# Patient Record
Sex: Male | Born: 1940 | Race: White | Hispanic: No | Marital: Married | Smoking: Former smoker
Health system: Southern US, Community
[De-identification: ages and names within clinical notes are randomized; demographics above are authoritative.]

## PROBLEM LIST (undated history)

## (undated) DIAGNOSIS — N400 Enlarged prostate without lower urinary tract symptoms: Secondary | ICD-10-CM

## (undated) DIAGNOSIS — E785 Hyperlipidemia, unspecified: Secondary | ICD-10-CM

## (undated) DIAGNOSIS — I10 Essential (primary) hypertension: Secondary | ICD-10-CM

## (undated) DIAGNOSIS — M1990 Unspecified osteoarthritis, unspecified site: Secondary | ICD-10-CM

## (undated) DIAGNOSIS — R5383 Other fatigue: Secondary | ICD-10-CM

## (undated) HISTORY — DX: Essential (primary) hypertension: I10

## (undated) HISTORY — DX: Hyperlipidemia, unspecified: E78.5

## (undated) HISTORY — PX: JOINT REPLACEMENT: SHX530

## (undated) HISTORY — DX: Other fatigue: R53.83

## (undated) HISTORY — PX: HERNIA REPAIR: SHX51

---

## 2012-01-26 ENCOUNTER — Encounter (HOSPITAL_COMMUNITY)
Admission: RE | Admit: 2012-01-26 | Discharge: 2012-01-26 | Disposition: A | Discharge status: Home / Self Care | Payer: Medicare Other | Source: Ambulatory Visit | Attending: Orthopedic Surgery | Admitting: Orthopedic Surgery

## 2012-01-26 ENCOUNTER — Encounter (HOSPITAL_COMMUNITY)
Admission: RE | Admit: 2012-01-26 | Discharge: 2012-01-26 | Disposition: A | Discharge status: Home / Self Care | Payer: Medicare Other | Source: Ambulatory Visit | Attending: Orthopaedic Surgery | Admitting: Orthopaedic Surgery

## 2012-01-26 ENCOUNTER — Encounter (HOSPITAL_COMMUNITY): Payer: Self-pay

## 2012-01-26 HISTORY — DX: Unspecified osteoarthritis, unspecified site: M19.90

## 2012-01-26 HISTORY — DX: Essential (primary) hypertension: I10

## 2012-01-26 HISTORY — DX: Benign prostatic hyperplasia without lower urinary tract symptoms: N40.0

## 2012-01-26 LAB — URINALYSIS, ROUTINE W REFLEX MICROSCOPIC
Bilirubin Urine: NEGATIVE
Glucose, UA: NEGATIVE mg/dL
Hgb urine dipstick: NEGATIVE
Ketones, ur: NEGATIVE mg/dL
Leukocytes, UA: NEGATIVE
Nitrite: NEGATIVE
Protein, ur: NEGATIVE mg/dL
Specific Gravity, Urine: 1.013 (ref 1.005–1.030)
Urobilinogen, UA: 0.2 mg/dL (ref 0.0–1.0)
pH: 7 (ref 5.0–8.0)

## 2012-01-26 LAB — CBC
HCT: 40 % (ref 39.0–52.0)
Hemoglobin: 13.4 g/dL (ref 13.0–17.0)
MCHC: 33.5 g/dL (ref 30.0–36.0)
RBC: 4.67 MIL/uL (ref 4.22–5.81)
WBC: 8.8 10*3/uL (ref 4.0–10.5)

## 2012-01-26 LAB — TYPE AND SCREEN
ABO/RH(D): A POS
Antibody Screen: NEGATIVE

## 2012-01-26 LAB — COMPREHENSIVE METABOLIC PANEL
ALT: 11 U/L (ref 0–53)
Alkaline Phosphatase: 85 U/L (ref 39–117)
BUN: 17 mg/dL (ref 6–23)
Chloride: 101 mEq/L (ref 96–112)
GFR calc Af Amer: 82 mL/min — ABNORMAL LOW (ref >90)
Glucose, Bld: 89 mg/dL (ref 70–99)
Potassium: 3.8 mEq/L (ref 3.5–5.1)
Sodium: 139 mEq/L (ref 135–145)
Total Bilirubin: 0.3 mg/dL (ref 0.3–1.2)
Total Protein: 7.8 g/dL (ref 6.0–8.3)

## 2012-01-26 LAB — PROTIME-INR
INR: 1.04 (ref 0.00–1.49)
Prothrombin Time: 13.5 seconds (ref 11.6–15.2)

## 2012-01-26 LAB — APTT: aPTT: 29 seconds (ref 24–37)

## 2012-01-26 LAB — SURGICAL PCR SCREEN: MRSA, PCR: NEGATIVE

## 2012-01-26 LAB — ABO/RH: ABO/RH(D): A POS

## 2012-01-26 NOTE — Pre-Procedure Instructions (Addendum)
20 [address]  01/26/2012   Your procedure is scheduled on:  Tuesday 02-02-2012  Report to [facility] Stay [facility] at 8:25 AM.  Call this number if you have problems the morning of surgery: [phone]   Remember:   Do not eat food or drink:After Midnight  .Monday .    Take these medicines the morning of surgery with A SIP OF WATER: none   Do not wear jewelry,  Do not wear lotions, powders, or perfumes. You may wear deodorant.  Do not shave 48 hours prior to surgery. Men may shave face and neck.  Do not bring valuables to the hospital.  Contacts, dentures or bridgework may not be worn into surgery.  Leave suitcase in the car. After surgery it may be brought to your room.  For patients admitted to the hospital, checkout time is 11:00 AM the day of discharge.      Special Instructions: Incentive Spirometry - Practice and bring it with you on the day of surgery. and CHG Shower Use Special Wash: 1/2 bottle night before surgery and 1/2 bottle morning of surgery.     Please read over the following fact sheets that you were given: Pain Booklet, Coughing and Deep Breathing, Blood Transfusion Information, MRSA Information and Surgical Site Infection Prevention

## 2012-01-27 LAB — URINE CULTURE
Colony Count: NO GROWTH
Culture: NO GROWTH

## 2012-01-27 NOTE — Consult Note (Addendum)
Anesthesia chart review: Patient is a 71 year old male scheduled for exploration left total knee, possible removal of prosthesis and placement of antibiotic spacer versus revision of total knee on 02/02/12 by Dr. Cleophas Dunker. History includes nonsmoker, hypertension, arthritis (RA), BPH. PCP is Dr. Darius Bump at [facility]. He has cleared William Arroyo for this procedure.  EKG on 01/05/12 (PCP) showed NSR, borderline LAD, first degree AVB.  Chest x-ray on 01/26/2012 showed no acute cardiopulmonary abnormality.  Labs noted.  Anticipate he can proceed as planned.  Shonna Chock, PA-C

## 2012-02-01 MED ORDER — CHLORHEXIDINE GLUCONATE 4 % EX LIQD: 60.0000 mL | Freq: Every day | CUTANEOUS | Status: DC

## 2012-02-01 MED ORDER — SODIUM CHLORIDE 0.9 % IV SOLN: INTRAVENOUS | Status: DC

## 2012-02-01 MED ORDER — CHLORHEXIDINE GLUCONATE 4 % EX LIQD: 60.0000 mL | Freq: Once | CUTANEOUS | Status: DC

## 2012-02-01 MED ORDER — ACETAMINOPHEN 10 MG/ML IV SOLN
1000.0000 mg | Freq: Once | INTRAVENOUS | Status: AC
  Administered 2012-02-02: 1000 mg via INTRAVENOUS
  Filled 2012-02-01: qty 100

## 2012-02-01 NOTE — H&P (Signed)
CHIEF COMPLAINT:  Painful left knee.    HISTORY OF PRESENT ILLNESS:  William Arroyo is a very pleasant 71 year old white male who is seen today for evaluation of his left knee.  He has had a left total knee replacement, performed at [facility], about 13 years ago.  Over the past 4-5 years, though, he has been having recurrent effusions of the knee.  He has had multiple aspirations by Dr. Darius Bump and also several aspirations over the last several months by Dr. Cleophas Dunker.  He has been having catching symptoms in his knee now over the past 4-5 months.  He has a history of rheumatoid arthritis and on Plaquenil.  The pain is intermittent and mild.  The biggest problem is just his recurrent swelling and catching symptoms.  He does not have much pain at nighttime.     He has been seen and an aspiration of the knee was obtained with 16,000 white cells and a few white cells on the Gram stain, but cultures were negative.  The sed rate was 22 and C-reactive protein 0.87.  These were just mildly elevated.     A 3-phase bone scan revealed marked synovitis of the left knee, particularly around the patella, including superior, medial and lateral to the patella.  Intense abnormal activity at the patella itself.  This could represent loosening of the patella component.     He continues to have pain and discomfort and the effusions.  Even a corticosteroid injection was performed in June in hopes that the synovitis would improve, however, he continues to have symptoms.  He is seen today for reevaluation.     PAST MEDICAL HISTORY:  In general, his health is good.  Hospitalizations have included that of his total joint replacement.     MEDICATIONS:   1.  Flomax 0.4 mg daily. 2.  Aspirin 81 mg.  3.  Viagra 100 mg p.r.n.  4.  Lisinopril/hydrochlorothiazide 10/12.5.     REVIEW OF SYSTEMS:  A 14-point review of systems is positive for glasses, decreased hearing and hypertension for 5 years.  He has had pain with urination for 10  years and has had prostate problems and he does use Flomax for that.  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    ALLERGIES:  Methotrexate.     FAMILY HISTORY:  Positive for diabetes, hypertension and arthritis.   SOCIAL HISTORY:  He is a 71 year old white male who is retired from the [employer].  He smoked as a teen, just for a very short period of time.  He uses alcohol intermittently.    PHYSICAL EXAMINATION:  Today reveals a 71 year old white, well developed, well nourished, alert, pleasant and cooperative in moderate distress secondary to left knee swelling and pain.  He is 5 feet 11 inches.  He weighs 175 pounds.  BMI 24.4   Vital signs reveal a temperature of 97.8, pulse 69, respiration 18, blood pressure 121/72.     His head is normocephalic.     Eyes, ears, nose and throat were benign.   Neck was supple with no thyromegaly.    Chest revealed good expansion.    Lungs are essentially clear to auscultation.    Cardiac had a regular rhythm and rate, normal S1 and S2, no murmurs noted.   Pulses were 1+ bilaterally and symmetric in the lower extremities.    Abdomen shows scaphoid soft and nontender.  No masses palpable and normal bowel sounds present.  Genital, rectal and breast exam not indicated for an orthopedic evaluation.     CNS:  He is alert and oriented x3 and cranial nerves II-XII grossly intact.     Today he has range of motion from 3 degrees to 105 degrees.  He has good ligamentous stability.  He does have a little bit of crepitus with range of motion.  The calf is supple and nontender.  He is neurovascularly intact distally.  He does have a mild anterior drawer.     CLINICAL IMPRESSION:   1.  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   2.  Recurrent effusions.   3.  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    RECOMMENDATIONS:  At this time we feel that he is a candidate for surgical  intervention. that of an exploration of the knee with possible replacement of the patella or possibly that of removing all prosthesis and doing a revision or possibly that of taking all components out and placement with an antibiotic spacer.     The procedure, risks and benefits were fully explained to him and he is understanding.  He would like to proceed with this in the very near future.    Oris Drone Aleda Grana [facility] [phone]  02/01/2012 6:40 PM

## 2012-02-02 ENCOUNTER — Inpatient Hospital Stay (HOSPITAL_COMMUNITY): Payer: Medicare Other | Admitting: Vascular Surgery

## 2012-02-02 ENCOUNTER — Encounter (HOSPITAL_COMMUNITY): Payer: Self-pay | Admitting: Vascular Surgery

## 2012-02-02 ENCOUNTER — Inpatient Hospital Stay (HOSPITAL_COMMUNITY)
Admission: RE | Admit: 2012-02-02 | Discharge: 2012-02-04 | DRG: 488 | Disposition: A | Discharge status: Home Health | Payer: Medicare Other | Source: Ambulatory Visit | Attending: Orthopaedic Surgery | Admitting: Orthopaedic Surgery

## 2012-02-02 ENCOUNTER — Encounter (HOSPITAL_COMMUNITY)
Admission: RE | Disposition: A | Discharge status: Home Health | Payer: Self-pay | Source: Ambulatory Visit | Attending: Orthopaedic Surgery

## 2012-02-02 ENCOUNTER — Encounter (HOSPITAL_COMMUNITY): Payer: Self-pay | Admitting: *Deleted

## 2012-02-02 DIAGNOSIS — T8489XA Other specified complication of internal orthopedic prosthetic devices, implants and grafts, initial encounter: Principal | ICD-10-CM | POA: Diagnosis present

## 2012-02-02 DIAGNOSIS — Z01812 Encounter for preprocedural laboratory examination: Secondary | ICD-10-CM

## 2012-02-02 DIAGNOSIS — I10 Essential (primary) hypertension: Secondary | ICD-10-CM | POA: Diagnosis present

## 2012-02-02 DIAGNOSIS — Z8249 Family history of ischemic heart disease and other diseases of the circulatory system: Secondary | ICD-10-CM

## 2012-02-02 DIAGNOSIS — Z96659 Presence of unspecified artificial knee joint: Secondary | ICD-10-CM

## 2012-02-02 DIAGNOSIS — M069 Rheumatoid arthritis, unspecified: Secondary | ICD-10-CM | POA: Diagnosis present

## 2012-02-02 DIAGNOSIS — D62 Acute posthemorrhagic anemia: Secondary | ICD-10-CM | POA: Diagnosis not present

## 2012-02-02 DIAGNOSIS — Y92009 Unspecified place in unspecified non-institutional (private) residence as the place of occurrence of the external cause: Secondary | ICD-10-CM

## 2012-02-02 DIAGNOSIS — Z87891 Personal history of nicotine dependence: Secondary | ICD-10-CM

## 2012-02-02 DIAGNOSIS — Z8261 Family history of arthritis: Secondary | ICD-10-CM

## 2012-02-02 DIAGNOSIS — Z7901 Long term (current) use of anticoagulants: Secondary | ICD-10-CM

## 2012-02-02 DIAGNOSIS — T8484XA Pain due to internal orthopedic prosthetic devices, implants and grafts, initial encounter: Secondary | ICD-10-CM

## 2012-02-02 DIAGNOSIS — M6580 Other synovitis and tenosynovitis, unspecified site: Secondary | ICD-10-CM | POA: Diagnosis present

## 2012-02-02 DIAGNOSIS — Y831 Surgical operation with implant of artificial internal device as the cause of abnormal reaction of the patient, or of later complication, without mention of misadventure at the time of the procedure: Secondary | ICD-10-CM | POA: Diagnosis present

## 2012-02-02 DIAGNOSIS — Z833 Family history of diabetes mellitus: Secondary | ICD-10-CM

## 2012-02-02 DIAGNOSIS — Z79899 Other long term (current) drug therapy: Secondary | ICD-10-CM

## 2012-02-02 HISTORY — PX: TOTAL KNEE REVISION: SHX996

## 2012-02-02 SURGERY — TOTAL KNEE REVISION
Anesthesia: General | Site: Knee | Laterality: Left | Wound class: Clean

## 2012-02-02 MED ORDER — FENTANYL CITRATE 0.05 MG/ML IJ SOLN
INTRAMUSCULAR | Status: AC
  Filled 2012-02-02: qty 2

## 2012-02-02 MED ORDER — THROMBIN 20000 UNITS EX KIT
PACK | CUTANEOUS | Status: DC | PRN
  Administered 2012-02-02: 20000 [IU] via TOPICAL

## 2012-02-02 MED ORDER — FENTANYL CITRATE 0.05 MG/ML IJ SOLN
INTRAMUSCULAR | Status: DC | PRN
  Administered 2012-02-02: 250 ug via INTRAVENOUS

## 2012-02-02 MED ORDER — BUPIVACAINE-EPINEPHRINE PF 0.5-1:200000 % IJ SOLN
INTRAMUSCULAR | Status: DC | PRN
  Administered 2012-02-02: 30 mL

## 2012-02-02 MED ORDER — ONDANSETRON HCL 4 MG PO TABS: 4.0000 mg | ORAL_TABLET | Freq: Four times a day (QID) | ORAL | Status: DC | PRN

## 2012-02-02 MED ORDER — METHOCARBAMOL 500 MG PO TABS
500.0000 mg | ORAL_TABLET | Freq: Four times a day (QID) | ORAL | Status: DC | PRN
  Administered 2012-02-02: 500 mg via ORAL
  Filled 2012-02-02: qty 1

## 2012-02-02 MED ORDER — BUPIVACAINE-EPINEPHRINE PF 0.25-1:200000 % IJ SOLN
INTRAMUSCULAR | Status: AC
  Filled 2012-02-02: qty 30

## 2012-02-02 MED ORDER — TAMSULOSIN HCL 0.4 MG PO CAPS
0.4000 mg | ORAL_CAPSULE | Freq: Every day | ORAL | Status: DC
  Administered 2012-02-02 – 2012-02-03 (×2): 0.4 mg via ORAL
  Filled 2012-02-02 (×3): qty 1

## 2012-02-02 MED ORDER — ACETAMINOPHEN 10 MG/ML IV SOLN
INTRAVENOUS | Status: AC
  Filled 2012-02-02: qty 100

## 2012-02-02 MED ORDER — EPHEDRINE SULFATE 50 MG/ML IJ SOLN
INTRAMUSCULAR | Status: DC | PRN
  Administered 2012-02-02 (×2): 5 mg via INTRAVENOUS

## 2012-02-02 MED ORDER — NEOSTIGMINE METHYLSULFATE 1 MG/ML IJ SOLN
INTRAMUSCULAR | Status: DC | PRN
  Administered 2012-02-02: 3 mg via INTRAVENOUS

## 2012-02-02 MED ORDER — ROCURONIUM BROMIDE 100 MG/10ML IV SOLN
INTRAVENOUS | Status: DC | PRN
  Administered 2012-02-02: 50 mg via INTRAVENOUS

## 2012-02-02 MED ORDER — LISINOPRIL 10 MG PO TABS
10.0000 mg | ORAL_TABLET | Freq: Every day | ORAL | Status: DC
  Administered 2012-02-03 – 2012-02-04 (×2): 10 mg via ORAL
  Filled 2012-02-02 (×3): qty 1

## 2012-02-02 MED ORDER — PHENYLEPHRINE HCL 10 MG/ML IJ SOLN
10.0000 mg | INTRAVENOUS | Status: DC | PRN
  Administered 2012-02-02: 20 ug/min via INTRAVENOUS

## 2012-02-02 MED ORDER — BISACODYL 10 MG RE SUPP: 10.0000 mg | Freq: Every day | RECTAL | Status: DC | PRN

## 2012-02-02 MED ORDER — MIDAZOLAM HCL 5 MG/5ML IJ SOLN
INTRAMUSCULAR | Status: DC | PRN
  Administered 2012-02-02: 2 mg via INTRAVENOUS

## 2012-02-02 MED ORDER — DOCUSATE SODIUM 100 MG PO CAPS
100.0000 mg | ORAL_CAPSULE | Freq: Two times a day (BID) | ORAL | Status: DC
  Administered 2012-02-02 – 2012-02-04 (×4): 100 mg via ORAL
  Filled 2012-02-02 (×5): qty 1

## 2012-02-02 MED ORDER — ONDANSETRON HCL 4 MG/2ML IJ SOLN: 4.0000 mg | Freq: Four times a day (QID) | INTRAMUSCULAR | Status: DC | PRN

## 2012-02-02 MED ORDER — OXYCODONE HCL 5 MG PO TABS
5.0000 mg | ORAL_TABLET | ORAL | Status: DC | PRN
  Administered 2012-02-02 – 2012-02-04 (×2): 10 mg via ORAL
  Filled 2012-02-02 (×3): qty 2

## 2012-02-02 MED ORDER — GLYCOPYRROLATE 0.2 MG/ML IJ SOLN
INTRAMUSCULAR | Status: DC | PRN
  Administered 2012-02-02: 0.4 mg via INTRAVENOUS

## 2012-02-02 MED ORDER — METOCLOPRAMIDE HCL 5 MG/ML IJ SOLN: 5.0000 mg | Freq: Three times a day (TID) | INTRAMUSCULAR | Status: DC | PRN

## 2012-02-02 MED ORDER — PROPOFOL 10 MG/ML IV BOLUS
INTRAVENOUS | Status: DC | PRN
  Administered 2012-02-02: 150 mg via INTRAVENOUS

## 2012-02-02 MED ORDER — MAGNESIUM HYDROXIDE 400 MG/5ML PO SUSP: 30.0000 mL | Freq: Every day | ORAL | Status: DC | PRN

## 2012-02-02 MED ORDER — ALPRAZOLAM 0.5 MG PO TABS
0.5000 mg | ORAL_TABLET | Freq: Every day | ORAL | Status: DC
  Administered 2012-02-02 – 2012-02-03 (×2): 0.5 mg via ORAL
  Filled 2012-02-02 (×2): qty 1

## 2012-02-02 MED ORDER — HYDROCHLOROTHIAZIDE 12.5 MG PO CAPS
12.5000 mg | ORAL_CAPSULE | Freq: Every day | ORAL | Status: DC
  Administered 2012-02-03 – 2012-02-04 (×2): 12.5 mg via ORAL
  Filled 2012-02-02 (×3): qty 1

## 2012-02-02 MED ORDER — LISINOPRIL-HYDROCHLOROTHIAZIDE 10-12.5 MG PO TABS: 1.0000 | ORAL_TABLET | Freq: Every day | ORAL | Status: DC

## 2012-02-02 MED ORDER — MIDAZOLAM HCL 2 MG/2ML IJ SOLN: 0.5000 mg | Freq: Once | INTRAMUSCULAR | Status: DC | PRN

## 2012-02-02 MED ORDER — BUPIVACAINE-EPINEPHRINE 0.25% -1:200000 IJ SOLN
INTRAMUSCULAR | Status: DC | PRN
  Administered 2012-02-02: 30 mL

## 2012-02-02 MED ORDER — SODIUM CHLORIDE 0.9 % IV SOLN: INTRAVENOUS | Status: DC

## 2012-02-02 MED ORDER — HYDROMORPHONE HCL PF 1 MG/ML IJ SOLN: 0.2500 mg | INTRAMUSCULAR | Status: DC | PRN

## 2012-02-02 MED ORDER — METOCLOPRAMIDE HCL 10 MG PO TABS: 5.0000 mg | ORAL_TABLET | Freq: Three times a day (TID) | ORAL | Status: DC | PRN

## 2012-02-02 MED ORDER — VANCOMYCIN HCL IN DEXTROSE 1-5 GM/200ML-% IV SOLN
1000.0000 mg | INTRAVENOUS | Status: DC
  Administered 2012-02-02 – 2012-02-03 (×2): 1000 mg via INTRAVENOUS
  Filled 2012-02-02 (×3): qty 200

## 2012-02-02 MED ORDER — ONDANSETRON HCL 4 MG/2ML IJ SOLN
INTRAMUSCULAR | Status: DC | PRN
  Administered 2012-02-02: 4 mg via INTRAVENOUS

## 2012-02-02 MED ORDER — PHENOL 1.4 % MT LIQD: 1.0000 | OROMUCOSAL | Status: DC | PRN

## 2012-02-02 MED ORDER — MIDAZOLAM HCL 2 MG/2ML IJ SOLN
1.0000 mg | INTRAMUSCULAR | Status: DC | PRN
  Administered 2012-02-02: 1 mg via INTRAVENOUS

## 2012-02-02 MED ORDER — FLEET ENEMA 7-19 GM/118ML RE ENEM: 1.0000 | ENEMA | Freq: Once | RECTAL | Status: AC | PRN

## 2012-02-02 MED ORDER — PROMETHAZINE HCL 25 MG/ML IJ SOLN: 6.2500 mg | INTRAMUSCULAR | Status: DC | PRN

## 2012-02-02 MED ORDER — VANCOMYCIN HCL 1000 MG IV SOLR
1000.0000 mg | INTRAVENOUS | Status: DC | PRN
  Administered 2012-02-02: 1000 mg via INTRAVENOUS

## 2012-02-02 MED ORDER — FENTANYL CITRATE 0.05 MG/ML IJ SOLN
50.0000 ug | INTRAMUSCULAR | Status: DC | PRN
  Administered 2012-02-02: 50 ug via INTRAVENOUS

## 2012-02-02 MED ORDER — RIVAROXABAN 10 MG PO TABS
10.0000 mg | ORAL_TABLET | Freq: Every day | ORAL | Status: DC
  Administered 2012-02-03 – 2012-02-04 (×2): 10 mg via ORAL
  Filled 2012-02-02 (×2): qty 1

## 2012-02-02 MED ORDER — LACTATED RINGERS IV SOLN
INTRAVENOUS | Status: DC | PRN
  Administered 2012-02-02 (×2): via INTRAVENOUS

## 2012-02-02 MED ORDER — HYDROMORPHONE HCL PF 1 MG/ML IJ SOLN
0.5000 mg | INTRAMUSCULAR | Status: DC | PRN
  Administered 2012-02-03: 0.5 mg via INTRAVENOUS
  Filled 2012-02-02: qty 1

## 2012-02-02 MED ORDER — MEPERIDINE HCL 25 MG/ML IJ SOLN: 6.2500 mg | INTRAMUSCULAR | Status: DC | PRN

## 2012-02-02 MED ORDER — MIDAZOLAM HCL 2 MG/2ML IJ SOLN
INTRAMUSCULAR | Status: AC
  Filled 2012-02-02: qty 2

## 2012-02-02 MED ORDER — LACTATED RINGERS IV SOLN
INTRAVENOUS | Status: DC
  Administered 2012-02-02: 09:00:00 via INTRAVENOUS

## 2012-02-02 MED ORDER — ACETAMINOPHEN 10 MG/ML IV SOLN
1000.0000 mg | Freq: Four times a day (QID) | INTRAVENOUS | Status: AC
  Administered 2012-02-02 – 2012-02-03 (×4): 1000 mg via INTRAVENOUS
  Filled 2012-02-02 (×4): qty 100

## 2012-02-02 MED ORDER — VANCOMYCIN HCL IN DEXTROSE 1-5 GM/200ML-% IV SOLN
INTRAVENOUS | Status: AC
  Filled 2012-02-02: qty 200

## 2012-02-02 MED ORDER — METHOCARBAMOL 100 MG/ML IJ SOLN
500.0000 mg | Freq: Four times a day (QID) | INTRAVENOUS | Status: DC | PRN
  Filled 2012-02-02: qty 5

## 2012-02-02 MED ORDER — THROMBIN 20000 UNITS EX KIT
PACK | CUTANEOUS | Status: AC
  Filled 2012-02-02: qty 1

## 2012-02-02 MED ORDER — KETOROLAC TROMETHAMINE 15 MG/ML IJ SOLN: 7.5000 mg | Freq: Four times a day (QID) | INTRAMUSCULAR | Status: DC

## 2012-02-02 MED ORDER — SODIUM CHLORIDE 0.9 % IR SOLN
Status: DC | PRN
  Administered 2012-02-02: 3000 mL
  Administered 2012-02-02: 1000 mL

## 2012-02-02 MED ORDER — ALUM & MAG HYDROXIDE-SIMETH 200-200-20 MG/5ML PO SUSP
30.0000 mL | ORAL | Status: DC | PRN
  Administered 2012-02-03: 30 mL via ORAL
  Filled 2012-02-02: qty 30

## 2012-02-02 MED ORDER — SODIUM CHLORIDE 0.9 % IV SOLN
INTRAVENOUS | Status: DC | PRN
  Administered 2012-02-02: 12:00:00 via INTRAVENOUS

## 2012-02-02 MED ORDER — MENTHOL 3 MG MT LOZG: 1.0000 | LOZENGE | OROMUCOSAL | Status: DC | PRN

## 2012-02-02 SURGICAL SUPPLY — 62 items
BANDAGE ESMARK 6X9 LF (GAUZE/BANDAGES/DRESSINGS) ×1 IMPLANT
BLADE SAGITTAL 25.0X1.19X90 (BLADE) ×2 IMPLANT
BLADE SAW SGTL 13.0X1.19X90.0M (BLADE) IMPLANT
BNDG ESMARK 6X9 LF (GAUZE/BANDAGES/DRESSINGS) ×2
BONE CHIP PRESERV 20CC (Bone Implant) ×2 IMPLANT
BOWL SMART MIX CTS (DISPOSABLE) IMPLANT
CEMENT HV SMART SET (Cement) ×2 IMPLANT
CLOTH BEACON ORANGE TIMEOUT ST (SAFETY) ×2 IMPLANT
CONT SPECI 4OZ STER CLIK (MISCELLANEOUS) ×2 IMPLANT
COVER SURGICAL LIGHT HANDLE (MISCELLANEOUS) ×2 IMPLANT
CUFF TOURNIQUET SINGLE 34IN LL (TOURNIQUET CUFF) ×2 IMPLANT
CUFF TOURNIQUET SINGLE 44IN (TOURNIQUET CUFF) IMPLANT
DRAPE EXTREMITY T 121X128X90 (DRAPE) ×2 IMPLANT
DRSG ADAPTIC 3X8 NADH LF (GAUZE/BANDAGES/DRESSINGS) IMPLANT
DRSG PAD ABDOMINAL 8X10 ST (GAUZE/BANDAGES/DRESSINGS) IMPLANT
DURAPREP 26ML APPLICATOR (WOUND CARE) ×2 IMPLANT
ELECT REM PT RETURN 9FT ADLT (ELECTROSURGICAL) ×2
ELECTRODE REM PT RTRN 9FT ADLT (ELECTROSURGICAL) ×1 IMPLANT
EVACUATOR 1/8 PVC DRAIN (DRAIN) IMPLANT
FACESHIELD LNG OPTICON STERILE (SAFETY) ×8 IMPLANT
GLOVE BIO SURGEON STRL SZ8.5 (GLOVE) ×2 IMPLANT
GLOVE BIOGEL PI IND STRL 8 (GLOVE) ×2 IMPLANT
GLOVE BIOGEL PI IND STRL 8.5 (GLOVE) ×1 IMPLANT
GLOVE BIOGEL PI INDICATOR 8 (GLOVE) ×2
GLOVE BIOGEL PI INDICATOR 8.5 (GLOVE) ×1
GLOVE ECLIPSE 8.0 STRL XLNG CF (GLOVE) ×4 IMPLANT
GLOVE ECLIPSE 8.5 STRL (GLOVE) ×2 IMPLANT
GLOVE SURG ORTHO 8.5 STRL (GLOVE) ×2 IMPLANT
GLOVE SURG SS PI 7.5 STRL IVOR (GLOVE) ×2 IMPLANT
GOWN PREVENTION PLUS XLARGE (GOWN DISPOSABLE) IMPLANT
GOWN PREVENTION PLUS XXLARGE (GOWN DISPOSABLE) ×4 IMPLANT
GOWN STRL NON-REIN LRG LVL3 (GOWN DISPOSABLE) ×2 IMPLANT
GOWN STRL REIN 3XL XLG LVL4 (GOWN DISPOSABLE) IMPLANT
GOWN STRL REIN XL XLG (GOWN DISPOSABLE) ×2 IMPLANT
HANDPIECE INTERPULSE COAX TIP (DISPOSABLE) ×1
KIT BASIN OR (CUSTOM PROCEDURE TRAY) ×2 IMPLANT
KIT ROOM TURNOVER OR (KITS) ×2 IMPLANT
LPS ARTI SURF EF 5-6 14MM (Orthopedic Implant) ×2 IMPLANT
MANIFOLD NEPTUNE II (INSTRUMENTS) ×2 IMPLANT
NEEDLE 22X1 1/2 (OR ONLY) (NEEDLE) ×2 IMPLANT
NS IRRIG 1000ML POUR BTL (IV SOLUTION) ×2 IMPLANT
PACK TOTAL JOINT (CUSTOM PROCEDURE TRAY) ×2 IMPLANT
PAD ARMBOARD 7.5X6 YLW CONV (MISCELLANEOUS) ×2 IMPLANT
PAD CAST 4YDX4 CTTN HI CHSV (CAST SUPPLIES) ×1 IMPLANT
PADDING CAST COTTON 4X4 STRL (CAST SUPPLIES) ×1
PATELLA ZIMMER 35MM (Orthopedic Implant) ×2 IMPLANT
SET HNDPC FAN SPRY TIP SCT (DISPOSABLE) ×1 IMPLANT
SPONGE GAUZE 4X4 12PLY (GAUZE/BANDAGES/DRESSINGS) IMPLANT
STAPLER VISISTAT 35W (STAPLE) ×2 IMPLANT
SUCTION FRAZIER TIP 10 FR DISP (SUCTIONS) IMPLANT
SUT BONE WAX W31G (SUTURE) ×2 IMPLANT
SUT ETHIBOND NAB CT1 #1 30IN (SUTURE) ×6 IMPLANT
SUT VIC AB 0 CT1 27 (SUTURE) ×1
SUT VIC AB 0 CT1 27XBRD ANBCTR (SUTURE) ×1 IMPLANT
SUT VIC AB 2-0 FS1 27 (SUTURE) ×2 IMPLANT
SWAB COLLECTION DEVICE MRSA (MISCELLANEOUS) ×2 IMPLANT
SYR CONTROL 10ML LL (SYRINGE) ×2 IMPLANT
TOWER CARTRIDGE SMART MIX (DISPOSABLE) IMPLANT
TRAY FOLEY CATH 14FR (SET/KITS/TRAYS/PACK) ×2 IMPLANT
TUBE ANAEROBIC SPECIMEN COL (MISCELLANEOUS) ×2 IMPLANT
WATER STERILE IRR 1000ML POUR (IV SOLUTION) ×2 IMPLANT
WRAP KNEE MAXI GEL POST OP (GAUZE/BANDAGES/DRESSINGS) ×2 IMPLANT

## 2012-02-02 NOTE — Progress Notes (Signed)
Orthopedic Tech Progress Note Patient Details:  William Arroyo 1940-12-18 [id number]  Patient ID: William Arroyo, male   DOB: 03/05/1941, 71 y.o.   MRN: [id number] Viewed order from doctor's order list  Nikki Dom 02/02/2012, 7:47 PM

## 2012-02-02 NOTE — Progress Notes (Signed)
Patient ID: William Arroyo, male   DOB: [birth year], 71 y.o.   MRN: [id number] The recent History & Physical has been reviewed. I have personally examined the patient today. There is no interval change to the documented History & Physical. The patient would like to proceed with the procedure.  Norlene Campbell W 02/02/2012,  10:40 AM

## 2012-02-02 NOTE — Op Note (Signed)
PATIENT ID:      Brysten Defer  MRN:     [id number] DOB/AGE:    [birth year] / 71 y.o.       OPERATIVE REPORT    DATE OF PROCEDURE:  02/02/2012       PREOPERATIVE DIAGNOSIS:   Painful, Swollen Left Total Knee Replacement with normal sed rate and CRP and bone scan c/w synovitis, hx of RA                                                       There is no height or weight on file to calculate BMI.     POSTOPERATIVE DIAGNOSIS:   Painful, Swollen Left Total Knee Replacement -same                                                                    There is no height or weight on file to calculate BMI.     PROCEDURE:  Procedure(s):  Exploration of left TKR, aggressive synovectomy with stat synovial evaluation c/w/ RA synovitis, exchange of poly components and local bone grafting of bony erosions     SURGEON:  Norlene Campbell, MD    ASSISTANT:   Jacqualine Code, PA-C   (Present and scrubbed throughout the case, critical for assistance with exposure, retraction, instrumentation, and closure.)          ANESTHESIA: regional and general     DRAINS: (left knee) Hemovact drain(s) in the open with  Suction Open :      TOURNIQUET TIME:  Total Tourniquet Time Documented: Thigh (Left) - 78 minutes    COMPLICATIONS:  None   CONDITION:  stable  PROCEDURE IN [id number]   Cleophas Dunker, Ramonte Mena W 02/02/2012, 1:08 PM

## 2012-02-02 NOTE — Progress Notes (Signed)
Orthopedic Tech Progress Note Patient Details:  William Arroyo August 05, 1940 [id number]  CPM Left Knee CPM Left Knee: On Left Knee Flexion (Degrees): 60  Left Knee Extension (Degrees): 0  Additional Comments: trapeze bar patient helper   Nikki Dom 02/02/2012, 7:47 PM

## 2012-02-02 NOTE — Transfer of Care (Signed)
Immediate Anesthesia Transfer of Care Note  Patient: William Arroyo  Procedure(s) Performed: Procedure(s) (LRB) with comments: TOTAL KNEE REVISION (Left) - Exploration Left Total knee replacement ,synovectomy and poly exchange articular and patella  Patient Location: PACU  Anesthesia Type: General  Level of Consciousness: sedated and patient cooperative  Airway & Oxygen Therapy: Patient Spontanous Breathing and Patient connected to nasal cannula oxygen  Post-op Assessment: Report given to PACU RN, Post -op Vital signs reviewed and stable and Patient moving all extremities  Post vital signs: Reviewed and stable  Complications: No apparent anesthesia complications

## 2012-02-02 NOTE — Progress Notes (Signed)
ANTIBIOTIC CONSULT NOTE - INITIAL  Pharmacy Consult for vancomycin   Indication: surgical prophylaxis  Assessment: 71 year old male s/p L knee revision. Patient ordered vancomycin for surgical prophylaxis.   Goal of Therapy:  Vancomycin trough level 10-15 mcg/ml  Plan:  Vancomycin 1g IV q 24 hours Length of therapy?   Allergies  Allergen Reactions  . Methotrexate Derivatives Other (See Comments)    Mouth sores     Patient Measurements: Height: 5' (152.4 cm) Weight: 170 lb (77.111 kg) IBW/kg (Calculated) : 50    Vital Signs: Temp: 97.5 F (36.4 C) (09/24 1730) Temp src: Oral (09/24 0816) BP: 114/70 mmHg (09/24 1730) Pulse Rate: 55  (09/24 1730) Intake/Output from previous day:   Intake/Output from this shift: Total I/O In: 1800 [I.V.:1650; Other:150] Out: 850 [Urine:600; Other:150; Blood:100]  Labs: No results found for this basename: WBC:3,HGB:3,PLT:3,LABCREA:3,CREATININE:3 in the last 72 hours Estimated Creatinine Clearance: 56.6 ml/min (by C-G formula based on Cr of 1.03). No results found for this basename: VANCOTROUGH:2,VANCOPEAK:2,VANCORANDOM:2,GENTTROUGH:2,GENTPEAK:2,GENTRANDOM:2,TOBRATROUGH:2,TOBRAPEAK:2,TOBRARND:2,AMIKACINPEAK:2,AMIKACINTROU:2,AMIKACIN:2, in the last 72 hours   Microbiology: Recent Results (from the past 720 hour(s))  SURGICAL PCR SCREEN     Status: Normal   Collection Time   01/26/12  3:02 PM      Component Value Range Status Comment   MRSA, PCR NEGATIVE  NEGATIVE Final    Staphylococcus aureus NEGATIVE  NEGATIVE Final   URINE CULTURE     Status: Normal   Collection Time   01/26/12  3:04 PM      Component Value Range Status Comment   Specimen Description URINE, CLEAN CATCH   Final    Special Requests NONE   Final    Culture  Setup Time 01/26/2012 16:07   Final    Colony Count NO GROWTH   Final    Culture NO GROWTH   Final    Report Status 01/27/2012 FINAL   Final     Medical History: Past Medical History  Diagnosis Date  .  Hypertension   . Prostate enlargement   . Arthritis    William Arroyo 02/02/2012,5:57 PM

## 2012-02-02 NOTE — Preoperative (Signed)
Beta Blockers   Reason not to administer Beta Blockers:Not Applicable 

## 2012-02-02 NOTE — Anesthesia Preprocedure Evaluation (Signed)
Anesthesia Evaluation  Patient identified by MRN, date of birth, ID band Patient awake    Reviewed: Allergy & Precautions, H&P , NPO status , Patient's Chart, lab work & pertinent test results  History of Anesthesia Complications Negative for: history of anesthetic complications  Airway Mallampati: I TM Distance: >3 FB Neck ROM: Full    Dental No notable dental hx. (+) Teeth Intact and Dental Advisory Given   Pulmonary neg pulmonary ROS,  breath sounds clear to auscultation  Pulmonary exam normal       Cardiovascular hypertension, Pt. on medications Rhythm:Regular Rate:Normal     Neuro/Psych negative neurological ROS  negative psych ROS   GI/Hepatic negative GI ROS, Neg liver ROS,   Endo/Other  negative endocrine ROS  Renal/GU negative Renal ROS     Musculoskeletal  (+) Arthritis -, Osteoarthritis,    Abdominal   Peds  Hematology   Anesthesia Other Findings   Reproductive/Obstetrics                           Anesthesia Physical Anesthesia Plan  ASA: II  Anesthesia Plan: General   Post-op Pain Management:    Induction: Intravenous  Airway Management Planned: Oral ETT  Additional Equipment:   Intra-op Plan:   Post-operative Plan: Extubation in OR  Informed Consent: I have reviewed the patients History and Physical, chart, labs and discussed the procedure including the risks, benefits and alternatives for the proposed anesthesia with the patient or authorized representative who has indicated his/her understanding and acceptance.   Dental advisory given  Plan Discussed with: CRNA and Surgeon  Anesthesia Plan Comments: (Plan routine monitors, GETA with femoral nerve block for post op analgesia)        Anesthesia Quick Evaluation

## 2012-02-02 NOTE — Anesthesia Procedure Notes (Addendum)
Anesthesia Regional Block:  Femoral nerve block  Pre-Anesthetic Checklist: ,, timeout performed, Correct Patient, Correct Site, Correct Laterality, Correct Procedure, Correct Position, site marked, Risks and benefits discussed,  Surgical consent,  Pre-op evaluation,  At surgeon's request and post-op pain management  Laterality: Left  Prep: chloraprep       Needles:  Injection technique: Single-shot  Needle Type: Stimulator Needle - 40     Needle Length: 4cm  Needle Gauge: 22 and 22 G    Additional Needles:  Procedures: nerve stimulator Femoral nerve block  Nerve Stimulator or Paresthesia:  Response: patella twitch, 0.45 mA, 0.1 ms,   Additional Responses:   Narrative:  Start time: 02/02/2012 9:48 AM End time: 02/02/2012 9:54 AM Injection made incrementally with aspirations every 5 mL.  Performed by: Personally  Anesthesiologist: Sandford Craze, MD  Additional Notes: Pt identified in Holding room.  Monitors applied. Working IV access confirmed. Sterile prep L groin.  #22ga PNS to patella twitch at 0.3mA threshold.  30cc 0.5% Bupivacaine with 1:200k epi injected incrementally after negative test dose.  Patient asymptomatic, VSS, no heme aspirated, tolerated well.   Sandford Craze, MD  Femoral nerve block Procedure Name: Intubation Date/Time: 02/02/2012 11:25 AM Performed by: Julianne Rice K Pre-anesthesia Checklist: Emergency Drugs available, Patient identified, Timeout performed, Suction available and Patient being monitored Patient Re-evaluated:Patient Re-evaluated prior to inductionOxygen Delivery Method: Circle system utilized Preoxygenation: Pre-oxygenation with 100% oxygen Intubation Type: IV induction Ventilation: Mask ventilation without difficulty Laryngoscope Size: Miller and 2 Grade View: Grade II Tube type: Oral Tube size: 8.0 mm Number of attempts: 1 Airway Equipment and Method: Stylet Placement Confirmation: ETT inserted through vocal cords under direct vision,   breath sounds checked- equal and bilateral and positive ETCO2 Secured at: 23 cm Tube secured with: Tape Dental Injury: Teeth and Oropharynx as per pre-operative assessment

## 2012-02-02 NOTE — Progress Notes (Signed)
Pt feels sensation able to wiggle toes with good poulses

## 2012-02-02 NOTE — Anesthesia Postprocedure Evaluation (Signed)
  Anesthesia Post-op Note  Patient: William Arroyo  Procedure(s) Performed: Procedure(s) (LRB) with comments: TOTAL KNEE REVISION (Left) - Exploration Left Total knee replacement ,synovectomy and poly exchange articular and patella  Patient Location: PACU  Anesthesia Type: GA combined with regional for post-op pain  Level of Consciousness: awake, alert , oriented and patient cooperative  Airway and Oxygen Therapy: Patient Spontanous Breathing and Patient connected to nasal cannula oxygen  Post-op Pain: none  Post-op Assessment: Post-op Vital signs reviewed, Patient's Cardiovascular Status Stable, Respiratory Function Stable, Patent Airway, No signs of Nausea or vomiting and Pain level controlled  Post-op Vital Signs: Reviewed and stable  Complications: No apparent anesthesia complications

## 2012-02-03 ENCOUNTER — Encounter (HOSPITAL_COMMUNITY): Payer: Self-pay | Admitting: Orthopaedic Surgery

## 2012-02-03 LAB — CBC
MCH: 28.6 pg (ref 26.0–34.0)
MCHC: 33.6 g/dL (ref 30.0–36.0)
MCV: 85.1 fL (ref 78.0–100.0)
Platelets: 245 10*3/uL (ref 150–400)
RBC: 3.77 MIL/uL — ABNORMAL LOW (ref 4.22–5.81)
RDW: 13.3 % (ref 11.5–15.5)

## 2012-02-03 LAB — BASIC METABOLIC PANEL
CO2: 27 mEq/L (ref 19–32)
Calcium: 8.5 mg/dL (ref 8.4–10.5)
Creatinine, Ser: 1.04 mg/dL (ref 0.50–1.35)
GFR calc Af Amer: 81 mL/min — ABNORMAL LOW (ref >90)
GFR calc non Af Amer: 70 mL/min — ABNORMAL LOW (ref >90)
Sodium: 133 mEq/L — ABNORMAL LOW (ref 135–145)

## 2012-02-03 MED ORDER — DOXYCYCLINE HYCLATE 100 MG PO TABS
100.0000 mg | ORAL_TABLET | Freq: Two times a day (BID) | ORAL | Status: DC
  Administered 2012-02-03 – 2012-02-04 (×2): 100 mg via ORAL
  Filled 2012-02-03 (×3): qty 1

## 2012-02-03 MED ORDER — ACETAMINOPHEN 10 MG/ML IV SOLN
1000.0000 mg | Freq: Four times a day (QID) | INTRAVENOUS | Status: DC
  Administered 2012-02-03 – 2012-02-04 (×2): 1000 mg via INTRAVENOUS
  Filled 2012-02-03 (×4): qty 100

## 2012-02-03 NOTE — Progress Notes (Signed)
Physical Therapy Evaluation Patient Details Name: William Arroyo MRN: [id number] DOB: 15-Jul-1940 Today's Date: 02/03/2012 Time: 3086-5784 PT Time Calculation (min): 20 min  PT Assessment / Plan / Recommendation Clinical Impression  Pt is 71 yo make s/p left TKA revision who is very motivated to get moving to the point that he is somewhat impulsive.  Pt educated in safety as well as knee positioning and exercises this morning. He will benefit from skilled PT to increase ROM and strength of the left knee to return to independence at home.  Recommend HHPT f/u at d/c.      PT Assessment  Patient needs continued PT services    Follow Up Recommendations  Home health PT;Supervision - Intermittent    Barriers to Discharge None      Equipment Recommendations  None recommended by PT    Recommendations for Other Services     Frequency 7X/week    Precautions / Restrictions Precautions Precautions: Knee Precaution Comments: discussed keeping towel roll under ankle in bed to counter natural down-slope of mattress which promotes partial knee extension Restrictions Weight Bearing Restrictions: Yes LLE Weight Bearing: Weight bearing as tolerated   Pertinent Vitals/Pain 2/10 left knee pain, no intervention needed per pt      Mobility  Bed Mobility Bed Mobility: Supine to Sit;Sitting - Scoot to Edge of Bed Supine to Sit: 5: Supervision;HOB flat Sitting - Scoot to Edge of Bed: 5: Supervision Details for Bed Mobility Assistance: pt uses hands to help left leg to the side of the bed and is able to sit straight up in bed and then pivot to the edge.  Hip flex sufficient to control leg to the floor Transfers Transfers: Sit to Stand;Stand to Sit Sit to Stand: 4: Min guard;From bed;With upper extremity assist Stand to Sit: 4: Min guard;To chair/3-in-1;With armrests Details for Transfer Assistance: min-guard for safety, pr demonstrated safe hand placement with RW Ambulation/Gait Ambulation/Gait  Assistance: 4: Min guard Ambulation Distance (Feet): 100 Feet Assistive device: Rolling walker Ambulation/Gait Assistance Details: min guard A given for safety, vc's for sequencing and to put wt through left leg and get heel to the ground.  Pt tends to rush and would have liked to ambulate further but was slightly dizzy and was instructed to keep ambulation frequent but shorter distances POD 1. Gait Pattern: Step-to pattern Gait velocity: WFL Stairs: No Wheelchair Mobility Wheelchair Mobility: No    Exercises Total Joint Exercises Ankle Circles/Pumps: AROM;Both;20 reps;Seated Quad Sets: AROM;Left;Strengthening;10 reps;Seated   PT Diagnosis: Abnormality of gait;Acute pain  PT Problem List: Decreased strength;Decreased range of motion;Decreased safety awareness;Decreased knowledge of use of DME;Decreased knowledge of precautions;Pain PT Treatment Interventions: DME instruction;Gait training;Stair training;Functional mobility training;Therapeutic activities;Therapeutic exercise;Balance training;Patient/family education   PT Goals Acute Rehab PT Goals PT Goal Formulation: With patient Time For Goal Achievement: 02/10/12 Potential to Achieve Goals: Good Pt will go Supine/Side to Sit: with modified independence;with HOB 0 degrees PT Goal: Supine/Side to Sit - Progress: Goal set today Pt will go Sit to Supine/Side: with HOB 0 degrees;with modified independence PT Goal: Sit to Supine/Side - Progress: Goal set today Pt will go Sit to Stand: with modified independence PT Goal: Sit to Stand - Progress: Goal set today Pt will go Stand to Sit: with modified independence PT Goal: Stand to Sit - Progress: Goal set today Pt will Ambulate: >150 feet;with modified independence;with rolling walker PT Goal: Ambulate - Progress: Goal set today Pt will Go Up / Down Stairs: 1-2 stairs;with rolling walker;with modified independence  PT Goal: Up/Down Stairs - Progress: Goal set today Pt will Perform Home  Exercise Program: Independently PT Goal: Perform Home Exercise Program - Progress: Goal set today  Visit Information  Last PT Received On: 02/03/12 Assistance Needed: +1    Subjective Data  Subjective: When can I walk again? Patient Stated Goal: return to home and "working"   Prior Functioning  Home Living Lives With: Spouse Available Help at Discharge: Family;Available 24 hours/day Type of Home: House Home Access: Stairs to enter [employer] of Steps: 1 Home Layout: One level Bathroom Shower/Tub: Health visitor: Standard Home Adaptive Equipment: Environmental consultant - four wheeled Additional Comments: pt's daughter lives nearby and can help after surgery, pt and wife are both retired, pt reports that wife is independent Prior Function Level of Independence: Independent Able to Take Stairs?: Yes Driving: Yes Vocation: Retired Comments: pt says, "I used to work, 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 baseline status? Safety/Judgement: Impulsive;Decreased awareness of need for assistance Safety/Judgement - Other Comments: pt is fiercely independent and does not feel he needs gait belt or assistance and does not like that he needs to have help to get up, explained safety procedures and need for precautions after a major surgery    Extremity/Trunk Assessment Right Upper Extremity Assessment RUE ROM/Strength/Tone: Within functional levels RUE Sensation: WFL - Light Touch RUE Coordination: WFL - gross motor Left Upper Extremity Assessment LUE ROM/Strength/Tone: Within functional levels LUE Sensation: WFL - Light Touch LUE Coordination: WFL -  gross motor Right Lower Extremity Assessment RLE ROM/Strength/Tone: Within functional levels RLE Sensation: WFL - Light Touch RLE Coordination: WFL - gross motor Left Lower Extremity Assessment LLE ROM/Strength/Tone: Deficits LLE ROM/Strength/Tone Deficits: hip flex 2/5, knee ext 2/5, knee flex AROM 90 degrees, extension 10 degrees LLE Sensation: WFL - Light Touch LLE Coordination: WFL - gross motor Trunk Assessment Trunk Assessment: Kyphotic (mild)   Balance Balance Balance Assessed: Yes Static Standing Balance Static Standing - Balance Support: No upper extremity supported;During functional activity Static Standing - Level of Assistance: 5: Stand by assistance  End of Session PT - End of Session Equipment Utilized During Treatment: Gait belt Activity Tolerance: Patient tolerated treatment well Patient left: in chair;with call bell/phone within reach Nurse Communication: Mobility status CPM Left Knee CPM Left Knee: Off Left Knee Flexion (Degrees): 90  Left Knee Extension (Degrees): 10   GP   Lyanne Co, PT  Acute Rehab Services  [phone]   Lyanne Co 02/03/2012, 9:04 AM

## 2012-02-03 NOTE — Progress Notes (Signed)
Physical Therapy Treatment Patient Details Name: William Arroyo MRN: [id number] DOB: June 07, 1940 Today's Date: 02/03/2012 Time: 4098-1191 PT Time Calculation (min): 29 min  PT Assessment / Plan / Recommendation Comments on Treatment Session  Pt continues to progress well with therapy today with minimal pain but also minimal sensation in the left LLE, quad only 2-/5.  Warned him that he could potentially be more sore tomorrow when meds wear off.  Pt ambulated 200' with RW and supervision, left in CPM 0-75 degrees.  PT will continue to follow.    Follow Up Recommendations  Home health PT;Supervision - Intermittent    Barriers to Discharge        Equipment Recommendations  None recommended by PT    Recommendations for Other Services    Frequency 7X/week   Plan Discharge plan remains appropriate;Frequency remains appropriate    Precautions / Restrictions Precautions Precautions: Knee Precaution Booklet Issued: No Restrictions Weight Bearing Restrictions: Yes LLE Weight Bearing: Weight bearing as tolerated   Pertinent Vitals/Pain 2/10 left knee, premedicated    Mobility  Bed Mobility Bed Mobility: Sit to Supine Sit to Supine: 6: Modified independent (Device/Increase time);HOB flat Transfers Transfers: Sit to Stand;Stand to Sit Sit to Stand: From chair/3-in-1;With upper extremity assist;5: Supervision Stand to Sit: 5: Supervision;To bed;With upper extremity assist Details for Transfer Assistance: pt having no troubles with transfers Ambulation/Gait Ambulation/Gait Assistance: 5: Supervision Ambulation Distance (Feet): 200 Feet Assistive device: Rolling walker Ambulation/Gait Assistance Details: let pt use his 4 wheel RW for ambulation this afternoon, vc's to keep RW close, tends to push it too far ahead.  No dizziness this afternoon. Gait Pattern: Step-through pattern Gait velocity: WFL Stairs: No Wheelchair Mobility Wheelchair Mobility: No    Exercises Total Joint  Exercises Ankle Circles/Pumps: AROM;Both;20 reps;Seated Quad Sets: Limitations;AROM;Strengthening;Both;Seated Quad Sets Limitations: 2-/5 quad contraction Hip ABduction/ADduction: AROM;Left;10 reps;Seated Straight Leg Raises: AROM;Left;10 reps;Seated Long Arc Quad: AAROM;Left;10 reps;Seated   PT Diagnosis:    PT Problem List:   PT Treatment Interventions:     PT Goals Acute Rehab PT Goals PT Goal Formulation: With patient Time For Goal Achievement: 02/10/12 Potential to Achieve Goals: Good Pt will go Supine/Side to Sit: with modified independence;with HOB 0 degrees PT Goal: Supine/Side to Sit - Progress: Progressing toward goal Pt will go Sit to Supine/Side: with HOB 0 degrees;with modified independence PT Goal: Sit to Supine/Side - Progress: Progressing toward goal Pt will go Sit to Stand: with modified independence PT Goal: Sit to Stand - Progress: Progressing toward goal Pt will go Stand to Sit: with modified independence PT Goal: Stand to Sit - Progress: Progressing toward goal Pt will Ambulate: >150 feet;with modified independence;with rolling walker PT Goal: Ambulate - Progress: Progressing toward goal Pt will Go Up / Down Stairs: 1-2 stairs;with rolling walker;with modified independence Pt will Perform Home Exercise Program: Independently PT Goal: Perform Home Exercise Program - Progress: Progressing toward goal  Visit Information  Last PT Received On: 02/03/12 Assistance Needed: +1    Subjective Data  Subjective: pt want to be disconnected from IV pole so he can walk freely Patient Stated Goal: return to home and "working"   Cognition  Overall Cognitive Status: Appears within functional limits for tasks assessed/performed Arousal/Alertness: Awake/alert Orientation Level: Oriented X4 / Intact Behavior During Session: [facility] for tasks performed Memory Deficits: pt did not repeat self during second session and was less impulsivem, just fiercely independent    Balance   Balance Balance Assessed: No  End of Session PT - End  of Session Equipment Utilized During Treatment: Gait belt Activity Tolerance: Patient tolerated treatment well Patient left: in bed;in CPM;with call bell/phone within reach Nurse Communication: Other (comment) (CPM 0-75 degrees)   GP   Lyanne Co, PT  Acute Rehab Services  [phone]   Lyanne Co 02/03/2012, 3:06 PM

## 2012-02-03 NOTE — Progress Notes (Signed)
Referral received for SNF. Chart reviewed and CSW has spoken with RNCM who indicates that patient is for DC to home with Home Health.  CSW to sign off. Please re-consult if CSW needs arise.  Quenten Nawaz T. Prabhjot Maddux, LCSWA  [phone]  

## 2012-02-03 NOTE — Progress Notes (Signed)
Patient ID: William Arroyo, male   DOB: 08-22-1940, 71 y.o.   MRN: [id number] PATIENT ID: William Arroyo        MRN:  [id number]          DOB/AGE: 24-Sep-1940 / 71 y.o.    William Campbell, MD   Jacqualine Code, PA-C [address] [place], [place]  [address]                             [phone]   PROGRESS NOTE  Subjective:  negative for Chest Pain  negative for Shortness of Breath  negative for Nausea/Vomiting   negative for Calf Pain    Tolerating Diet: yes         Patient reports pain as mild.    Good night without problem, anxious to start PT  Objective: Vital signs in last 24 hours:   Patient Vitals for the past 24 hrs:  BP Temp Temp src Pulse Resp SpO2 Height Weight  02/03/12 0506 104/64 mmHg 98.4 F (36.9 C) - 69  18  98 % - -  02/03/12 0257 101/64 mmHg 98.2 F (36.8 C) - 91  18  100 % - -  02/02/12 2100 104/59 mmHg 97.3 F (36.3 C) - 71  16  97 % - -  02/02/12 1731 - - - - - - 5' (1.524 m) 77.111 kg (170 lb)  02/02/12 1730 114/70 mmHg 97.5 F (36.4 C) - 55  16  - - -  02/02/12 1645 121/70 mmHg - - 54  14  99 % - -  02/02/12 1630 113/72 mmHg - - 60  16  99 % - -  02/02/12 1615 113/73 mmHg - - 58  19  100 % - -  02/02/12 1600 110/62 mmHg - - 55  16  100 % - -  02/02/12 1545 108/61 mmHg - - 52  16  100 % - -  02/02/12 1530 105/62 mmHg - - 59  15  99 % - -  02/02/12 1515 110/62 mmHg - - 62  17  98 % - -  02/02/12 1500 108/61 mmHg - - 55  16  100 % - -  02/02/12 1445 106/62 mmHg - - 57  19  100 % - -  02/02/12 1430 106/62 mmHg - - 54  19  100 % - -  02/02/12 1415 - - - 58  18  99 % - -  02/02/12 1350 - 97.8 F (36.6 C) - - - - - -  02/02/12 0959 - - - 73  18  100 % - -  02/02/12 0945 - - - 64  18  100 % - -  02/02/12 0816 134/82 mmHg 98.1 F (36.7 C) Oral 71  18  99 % - -      Intake/Output from previous day:   09/24 0701 - 09/25 0700 In: 1800 [I.V.:1650] Out: 1500 [Urine:900; Drains:350]   Intake/Output this shift:       Intake/Output      09/24 0701 -  09/25 0700 09/25 0701 - 09/26 0700   I.V. (mL/kg) 1650 (21.4)    Other 150    Total Intake(mL/kg) 1800 (23.3)    Urine (mL/kg/hr) 900 (0.5)    Drains 350    Other 150    Blood 100    Total Output 1500    Net +300            LABORATORY  DATA: No results found for this basename: WBC:7,HGB:7,HCT:7,PLT:7 in the last 168 hours No results found for this basename: NA:7,K:7,CL:7,CO2:7,BUN:7,CREATININE:7,GLUCOSE:7,CALCIUM:7 in the last 168 hours Lab Results  Component Value Date   INR 1.04 01/26/2012    Recent Radiographic Studies :   Chest 2 View  01/26/2012  *RADIOLOGY REPORT*  Clinical Data: 71 year old male preoperative study for knee surgery.  Hypertension.  CHEST - 2 VIEW  Comparison: None.  Findings: Mild dextroconvex thoracic scoliosis.  Cardiac size and mediastinal contours are within normal limits.  Mild biapical scarring.  No pneumothorax, pulmonary edema, pleural effusion or confluent pulmonary opacity. No acute osseous abnormality identified.  IMPRESSION: No acute cardiopulmonary abnormality.   Original Report Authenticated By: Harley Hallmark, M.D.      Examination:  General appearance: alert, cooperative and no distress  Wound Exam: clean, dry, intact   Drainage:  50 cc in last shift in hemovac-D/C'd  Motor Exam: EHL, FHL, Anterior Tibial and Posterior Tibial Intact  Sensory Exam: Superficial Peroneal, Deep Peroneal and Tibial normal  Vascular Exam: Normal  Assessment:    1 Day Post-Op  Procedure(s) (LRB): TOTAL KNEE REVISION (Left)  ADDITIONAL DIAGNOSIS:  Active Problems:  * No active hospital problems. *   no new problems   Plan: Physical Therapy as ordered Weight Bearing as Tolerated (WBAT)  DVT Prophylaxis:  Xarelto  DISCHARGE PLAN: Home  DISCHARGE NEEDS: HHPT, CPM and 3-in-1 comode seat   OOB with PT, check intraoperative cultures, lab      [facility], Heather Mckendree W 02/03/2012 7:20 AM

## 2012-02-03 NOTE — Op Note (Signed)
NAMEMarland Kitchen  William Arroyo, William Arroyo NO.:  [id number]  MEDICAL RECORD NO.:  [id number]  LOCATION:  5N19C                        FACILITY:  [facility]  PHYSICIAN:  Claude Manges. Danna Casella, M.D.DATE OF BIRTH:  Feb 10, 1941  DATE OF PROCEDURE:  02/02/2012 DATE OF DISCHARGE:                              OPERATIVE REPORT   PREOPERATIVE DIAGNOSES: 1. Recurrent pain and effusion, left total knee replacement with a     normal sed rate, C-reactive protein, and bone scan consistent with     synovitis. 2. History of rheumatoid arthritis-possible loosened or infected     components.  POSTOPERATIVE DIAGNOSES: 1. Recurrent pain and effusion, left total knee replacement with a     normal sed rate, C-reactive protein, and bone scan consistent with     synovitis. 2. History of rheumatoid arthritis-possible loosened or infected     components.  PROCEDURE:  Exploration of left total knee replacement with aggressive synovectomy and stat synovial evaluation consistent with rheumatoid arthritis, exchange of polyethylene components and local bone grafting of bony erosion.  SURGEON:  Claude Manges. Cleophas Dunker, MD  ASSISTANT:  Oris Drone. Petrarca, PA-C  ANESTHESIA:  General with supplemental femoral nerve block.  COMPLICATIONS:  None.  COMPONENTS:  I exchanged the polyethylene patellar button and the polyethylene bridging bearing.  PROCEDURE:  William Arroyo was met in the holding area, identified his left knee as appropriate operative site.  He did receive a preoperative femoral nerve block.  The patient was then transported to room #7 and placed under general anesthesia without difficulty.  The nursing staff inserted a Foley catheter.  The urine was clear.  The tourniquet was applied to the left thigh.  Leg was then prepped with Betadine scrub and DuraPrep from the tourniquet to the midfoot.  Sterile draping was performed.  With the extremity still elevated, it was Esmarch exsanguinated with a proximal  tourniquet at 250 mmHg.  Examination of the knee revealed a large effusion.  There was just a little opening with varus and valgus stress and negative anterior drawer sign.  Using the prior longitudinal incision, it was elliptically excised with 15 blade knife.  First layer of capsule was incised in the midline.  A medial parapatellar incision was then made with the Bovie.  There was an abundant minimally hazy yellow effusion and abundant reddish brown synovitis.  With the capsule open, the patella was then everted to 180 degrees.  The knee flexed to 90 degrees.  An aggressive synovectomy was performed.  There was synovitis posteriorly anteriorly and throughout the superior pouch.  There were some areas of pannus formation with erosion beneath the femoral component, perhaps as deep as 3/8th of an inch.  There were some areas of bony erosion to a lesser extent along the tibial component.  The preoperative bone scan revealed increased uptake only around the patella, possibly consistent with the patellar component loosening or just synovitis.  There was no evidence of increased uptake along the femur or the tibia and we very aggressively attempted to check for any loosening of the components and did not feel like they were loose.  I then sent a deep synovial specimen to the pathologist and had long  discussion on the phone.  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 The pathologist felt that this was more consistent with rheumatoid arthritis and it was infection and given the essentially normal sed rate, C-reactive protein, and bone scan, we elected not to remove the components.  We also felt that they were perfectly stable.  The polyethylene bridging bearing was then removed followed by the patellar component.  There was just minimal synovitis beneath  the patellar component and synovectomy of that was performed.  Three holes for the patellar button were also debrided of any residual methacrylate. At that point, we could visualize the posterior aspect of the knee and aggressive synovectomy was performed.  I did not see any further synovitis.  I also used a small angled curette to remove any synovitis on either side of the femoral component and along the tibial component.  The wound was then copiously irrigated with saline solution.  I elected to bone graft any bony defects in the femur and the tibia and small cortical cancellous chips of bone were then impacted and were perfectly stable.  We did trial several thickness polyethylene bridging bearing and felt that the 14 gave [place] perfect stability with no opening of the varus or valgus stress and still had full extension and flexion.  So we then further irrigated and inserted the final 14 mm polyethylene for the NexGen Zimmer femur.  We then also cemented a size 35 all poly patella button with 9 mm thickness using choline methacrylate.  This was applied with a patellar clamp and extraneous methacrylate was removed from its edges.  While awaiting for the methacrylate to mature, we injected 0.25% Marcaine with epinephrine into the deep capsule and I also applied spray thrombin because of the aggressive synovectomy and potential for increased bleeding.  At approximately 16 minutes, methacrylate was matured.  At that point, the tourniquet was deflated.  Any gross bleeding was Bovie coagulated. We had a nice dry field.  We did insert a medium-size Hemovac.  I did send both aerobic and anaerobic cultures of the synovium.  The deep capsule was closed with interrupted #1 Ethibond, superficial capsule closed with running 0 Vicryl, subcu with 3-0 Monocryl, and skin was closed with skin clips.  Sterile bulky dressing was applied followed by the patient's support stocking.  The patient  tolerated the procedure without complication.     Claude Manges. Cleophas Dunker, M.D.     PWW/MEDQ  D:  02/02/2012  T:  02/03/2012  Job:  [id number]

## 2012-02-03 NOTE — Progress Notes (Signed)
Utilization review completed.  

## 2012-02-04 LAB — CBC
MCH: 28.8 pg (ref 26.0–34.0)
MCHC: 33.7 g/dL (ref 30.0–36.0)
MCV: 85.6 fL (ref 78.0–100.0)
Platelets: 229 10*3/uL (ref 150–400)
RDW: 13.4 % (ref 11.5–15.5)

## 2012-02-04 LAB — BASIC METABOLIC PANEL
Calcium: 8.8 mg/dL (ref 8.4–10.5)
Creatinine, Ser: 1.1 mg/dL (ref 0.50–1.35)
GFR calc non Af Amer: 66 mL/min — ABNORMAL LOW (ref >90)
Sodium: 138 mEq/L (ref 135–145)

## 2012-02-04 MED ORDER — DOXYCYCLINE HYCLATE 100 MG PO TABS: 100.0000 mg | ORAL_TABLET | Freq: Two times a day (BID) | ORAL | Status: DC

## 2012-02-04 MED ORDER — OXYCODONE HCL 5 MG PO TABS: 5.0000 mg | ORAL_TABLET | ORAL | Status: DC | PRN

## 2012-02-04 MED ORDER — RIVAROXABAN 10 MG PO TABS: 10.0000 mg | ORAL_TABLET | Freq: Every day | ORAL | Status: DC

## 2012-02-04 MED ORDER — METHOCARBAMOL 500 MG PO TABS: 500.0000 mg | ORAL_TABLET | Freq: Four times a day (QID) | ORAL | Status: DC | PRN

## 2012-02-04 NOTE — Progress Notes (Signed)
CARE MANAGEMENT NOTE 02/04/2012  Patient:  William Arroyo, FLAWS   Account Number:  [id number]  Date Initiated:  02/04/2012  Documentation initiated by:  Vance Peper  Subjective/Objective Assessment:   71 yr old male s/p left knee revision     Action/Plan:   Cm spoke with patient regarding home health needs and DME. Patient has rolling walker, CPM to be delivered. Choice offered. Patient will be seen in [place] by [facility].   Anticipated DC Date:  02/04/2012   Anticipated DC Plan:  HOME W HOME HEALTH SERVICES      DC Planning Services  CM consult      [facility] Choice  HOME HEALTH   Choice offered to / List presented to:  C-1 Patient        HH arranged  HH-2 PT      [facility] agency  [facility]   Status of service:  Completed, signed off Medicare Important Message given?   (If response is "NO", the following Medicare IM given date fields will be blank) Date Medicare IM given:   Date Additional Medicare IM given:    Discharge Disposition:    Per UR Regulation:    If discussed at Long Length of Stay Meetings, dates discussed:    Comments:

## 2012-02-04 NOTE — Progress Notes (Signed)
Physical Therapy Treatment Patient Details Name: Kordell Jafri MRN: [id number] DOB: 02-Mar-1941 Today's Date: 02/04/2012 Time: 4098-1191 PT Time Calculation (min): 15 min  PT Assessment / Plan / Recommendation Comments on Treatment Session  Practiced step. Family present. No further questions. Pt to d/c later today.     Follow Up Recommendations  Home health PT;Supervision - Intermittent    Barriers to Discharge        Equipment Recommendations  None recommended by PT    Recommendations for Other Services    Frequency 7X/week   Plan Discharge plan remains appropriate    Precautions / Restrictions Restrictions Weight Bearing Restrictions: Yes LLE Weight Bearing: Weight bearing as tolerated   Pertinent Vitals/Pain 2/10 "sore/stiff" L knee    Mobility  Bed Mobility Bed Mobility: Not assessed Transfers Transfers: Stand to Sit;Sit to Stand Sit to Stand: 5: Supervision Stand to Sit: 5: Supervision Details for Transfer Assistance: VCs to extend L LE forward when sitting Ambulation/Gait Ambulation/Gait Assistance: 6: Modified independent (Device/Increase time) Ambulation Distance (Feet): 200 Feet Assistive device: Rolling walker Ambulation/Gait Assistance Details: Good gait speed.  Gait Pattern: Decreased stride length Stairs: Yes Stairs Assistance: 4: Min guard Stair Management Technique: Step to pattern;Forwards;Backwards Number of Stairs: 1  (1x backwards, 2x forwards)    Exercises    PT Diagnosis:    PT Problem List:   PT Treatment Interventions:     PT Goals Acute Rehab PT Goals Pt will go Sit to Stand: with modified independence PT Goal: Sit to Stand - Progress: Progressing toward goal Pt will go Stand to Sit: with modified independence PT Goal: Stand to Sit - Progress: Progressing toward goal Pt will Ambulate: >150 feet;with modified independence;with rolling walker PT Goal: Ambulate - Progress: Progressing toward goal Pt will Go Up / Down Stairs: 1-2  stairs;with modified independence;with rolling walker PT Goal: Up/Down Stairs - Progress: Progressing toward goal Pt will Perform Home Exercise Program: with supervision, verbal cues required/provided PT Goal: Perform Home Exercise Program - Progress: Progressing toward goal  Visit Information  Last PT Received On: 02/04/12 Assistance Needed: +1    Subjective Data  Subjective: "I'm ready" Patient Stated Goal: Home   Cognition  Overall Cognitive Status: Appears within functional limits for tasks assessed/performed Arousal/Alertness: Awake/alert Orientation Level: Appears intact for tasks assessed Behavior During Session: [facility] for tasks performed    Balance     End of Session PT - End of Session Equipment Utilized During Treatment: Gait belt Activity Tolerance: Patient tolerated treatment well Patient left:  (OT took over session)   GP     [name] [place] 02/04/2012, 1:29 PM [phone]

## 2012-02-04 NOTE — Progress Notes (Signed)
Physical Therapy Treatment Patient Details Name: William Arroyo MRN: [id number] DOB: February 05, 1941 Today's Date: 02/04/2012 Time: 4098-1191 PT Time Calculation (min): 44 min  PT Assessment / Plan / Recommendation Comments on Treatment Session  Plans for d/c home later today. continuing to perform well. pt reports increased soreness this session. also noted some increased difficulty with slr. will practice stair negotiation this afternoon.     Follow Up Recommendations  Home health PT;Supervision - Intermittent    Barriers to Discharge        Equipment Recommendations  None recommended by PT    Recommendations for Other Services    Frequency 7X/week   Plan Discharge plan remains appropriate    Precautions / Restrictions Restrictions Weight Bearing Restrictions: Yes LLE Weight Bearing: Weight bearing as tolerated   Pertinent Vitals/Pain 5/10 l knee    Mobility  Bed Mobility Bed Mobility: Not assessed Transfers Transfers: Sit to Stand;Stand to Sit Sit to Stand: 5: Supervision;From chair/3-in-1;With armrests Stand to Sit: 5: Supervision;To chair/3-in-1;With armrests Details for Transfer Assistance: VCs for pt to extend L LE forward when sitting.  Ambulation/Gait Ambulation/Gait Assistance: 5: Supervision Ambulation Distance (Feet): 200 Feet Assistive device: Rolling walker Gait Pattern: Step-through pattern    Exercises Total Joint Exercises Ankle Circles/Pumps: AROM;Both;10 reps;Seated Quad Sets: AROM;Left;10 reps;Seated Quad Sets Limitations: Towel roll under ankle. Poor quad activation.  Short Arc QuadBarbaraann Arroyo;Left;10 reps;Seated Heel Slides: AAROM;Left;5 reps;Seated Hip ABduction/ADduction: AAROM;Left;10 reps;Seated Straight Leg Raises: AAROM;Left;10 reps;Seated Long Arc Quad: AAROM;Left;10 reps;Seated   PT Diagnosis:    PT Problem List:   PT Treatment Interventions:     PT Goals Acute Rehab PT Goals Pt will go Sit to Stand: with modified independence PT Goal: Sit  to Stand - Progress: Progressing toward goal Pt will go Stand to Sit: with modified independence PT Goal: Stand to Sit - Progress: Progressing toward goal Pt will Ambulate: >150 feet;with modified independence;with rolling walker PT Goal: Ambulate - Progress: Progressing toward goal Pt will Perform Home Exercise Program: with supervision, verbal cues required/provided PT Goal: Perform Home Exercise Program - Progress: Progressing toward goal  Visit Information  Last PT Received On: 02/04/12 Assistance Needed: +1    Subjective Data  Subjective: "Im ready to go home" Patient Stated Goal: Home   Cognition  Overall Cognitive Status: Appears within functional limits for tasks assessed/performed Arousal/Alertness: Awake/Arroyo Orientation Level: Appears intact for tasks assessed Behavior During Session: [facility] for tasks performed    Balance     End of Session PT - End of Session Equipment Utilized During Treatment: Gait belt Activity Tolerance: Patient tolerated treatment well Patient left: in chair;with call bell/phone within reach CPM Left Knee CPM Left Knee: Off   GP     William Arroyo [facility] 02/04/2012, 11:42 AM [phone]

## 2012-02-04 NOTE — Progress Notes (Signed)
I agree with the following treatment note after reviewing documentation.   Johnston, Shere Eisenhart Brynn   OTR/L Pager: [phone] Office: [phone] .   

## 2012-02-04 NOTE — Discharge Summary (Signed)
HomeHomeHomeHome William Campbell, MD   William Code, PA-C [address] [place], [place]  [address]                             [phone]  PATIENT ID: William Arroyo        MRN:  [id number]          DOB/AGE: 1940/11/12 / 71 y.o.    DISCHARGE SUMMARY  ADMISSION DATE:    02/02/2012 DISCHARGE DATE:   02/04/2012   ADMISSION DIAGNOSIS: Painful, Swollen Left Total Knee Replacement    DISCHARGE DIAGNOSIS:  Painful, Swollen Left Total Knee Replacement    ADDITIONAL DIAGNOSIS: Active Problems:  * No active hospital problems. *   Past Medical History  Diagnosis Date  . Hypertension   . Prostate enlargement   . Arthritis     PROCEDURE:Left Procedure(s): TOTAL KNEE REVISION on 02/02/2012  CONSULTS: none     HISTORY: William Arroyo is a very pleasant 71 year old white male who is seen today for evaluation of his left knee. He has had a left total knee replacement, performed at [facility], about 13 years ago. Over the past 4-5 years, though, he has been having recurrent effusions of the knee. He has had multiple aspirations by Dr. Darius Bump and also several aspirations over the last several months by Dr. Cleophas Dunker. He has been having catching symptoms in his knee now over the past 4-5 months. He has a history of rheumatoid arthritis and on Plaquenil. The pain is intermittent and mild. The biggest problem is just his recurrent swelling and catching symptoms. He does not have much pain at nighttime.  He has been seen and an aspiration of the knee was obtained with 16,000 white cells and a few white cells on the Gram stain, but cultures were negative. The sed rate was 22 and C-reactive protein 0.87. These were just mildly elevated.  A 3-phase bone scan revealed marked synovitis of the left knee, particularly around the patella, including superior, medial and lateral to the patella. Intense abnormal activity at the patella itself. This could represent loosening of the patella component.  He continues to have  pain and discomfort and the effusions. Even a corticosteroid injection was performed in June in hopes that the synovitis would improve, however, he continues to have symptoms.    HOSPITAL COURSE:  William Arroyo is a 71 y.o. admitted on 02/02/2012 and found to have a diagnosis of Painful, Swollen Left Total Knee Replacement.  After appropriate laboratory studies were obtained  they were taken to the operating room on 02/02/2012 and underwent Left Procedure(s): TOTAL KNEE REVISION.   They were given perioperative antibiotics:  Anti-infectives     Start     Dose/Rate Route Frequency Ordered Stop   02/04/12 0000   doxycycline (VIBRA-TABS) 100 MG tablet        100 mg Oral Every 12 hours 02/04/12 1111     02/03/12 2200   doxycycline (VIBRA-TABS) tablet 100 mg        100 mg Oral Every 12 hours 02/03/12 0757     02/02/12 2200   vancomycin (VANCOCIN) IVPB 1000 mg/200 mL premix        1,000 mg 200 mL/hr over 60 Minutes Intravenous Every 24 hours 02/02/12 1757          .  Tolerated the procedure well.  Placed with a foley intraoperatively.  Given Ofirmev at induction and for 48 hours.    POD #1,  allowed out of bed to a chair.  PT for ambulation and exercise program.  Foley D/C'd in morning.  IV saline locked.  O2 discontionued. Hemovac pulled.  POD #2, continued PT and ambulation.  Did very well and wanted to be discharged home. .  The remainder of the hospital course was dedicated to ambulation and strengthening.   The patient was discharged on 2 Days Post-Op in  Stable condition.  Blood products given:none  DIAGNOSTIC STUDIES: Recent vital signs: Patient Vitals for the past 24 hrs:  BP Temp Pulse Resp SpO2  02/04/12 0714 97/80 mmHg 98.3 F (36.8 C) 69  16  97 %  02/04/12 0400 - - - 16  -  02/04/12 0000 - - - 16  -  02/08/2012 2141 120/76 mmHg 98.3 F (36.8 C) 65  18  100 %  2012/02/08 2000 - - - 16  -  Feb 08, 2012 1253 107/62 mmHg 98.2 F (36.8 C) 65  18  100 %  02-08-12 1200 - - - 16  97  %       Recent laboratory studies:  [facility] 02/04/12 0520 02-08-12 0805  WBC 6.7 6.4  HGB 10.6* 10.8*  HCT 31.5* 32.1*  PLT 229 245    Basename 02/04/12 0520 08-Feb-2012 0805  NA 138 133*  K 3.7 3.7  CL 103 98  CO2 26 27  BUN 13 12  CREATININE 1.10 1.04  GLUCOSE 114* 129*  CALCIUM 8.8 8.5   Lab Results  Component Value Date   INR 1.04 01/26/2012     Recent Radiographic Studies :   Chest 2 View  01/26/2012  *RADIOLOGY REPORT*  Clinical Data: 71 year old male preoperative study for knee surgery.  Hypertension.  CHEST - 2 VIEW  Comparison: None.  Findings: Mild dextroconvex thoracic scoliosis.  Cardiac size and mediastinal contours are within normal limits.  Mild biapical scarring.  No pneumothorax, pulmonary edema, pleural effusion or confluent pulmonary opacity. No acute osseous abnormality identified.  IMPRESSION: No acute cardiopulmonary abnormality.   Original Report Authenticated By: Harley Hallmark, M.D.     DISCHARGE INSTRUCTIONS: Discharge Orders    Future Orders Please Complete By Expires   Diet general      Call MD / Call 911      Comments:   If you experience chest pain or shortness of breath, CALL 911 and be transported to the hospital emergency room.  If you develope a fever above 101 F, pus (white drainage) or increased drainage or redness at the wound, or calf pain, call your surgeon's office.   Constipation Prevention      Comments:   Drink plenty of fluids.  Prune juice may be helpful.  You may use a stool softener, such as Colace (over the counter) 100 mg twice a day.  Use MiraLax (over the counter) for constipation as needed.   Increase activity slowly as tolerated      Patient may shower      Comments:   You may shower over the dressing.  Then may shower without a dressing once there is no drainage.  Do not wash over the wound.  If drainage remains, cover wound with plastic wrap and then shower.   Partial weight bearing      Comments:   50 % WEIGHT BEARING  AS TAUGHT IN PHYSICAL THERAPY   Driving restrictions      Comments:   No driving for 6 weeks   Lifting restrictions      Comments:  No lifting for 6 weeks   CPM      Comments:   Continuous passive motion machine (CPM):      Use the CPM from 0 to 60 for 6-8 hours per day.      You may increase by 5-10 per day.  You may break it up into 2 or 3 sessions per day.      Use CPM for 3-4 weeks or until you are told to stop.   TED hose      Comments:   Use stockings (TED hose) for 3 weeks on operative leg(s).  You may remove them at night for sleeping.   Change dressing      Comments:   Change dressing on Sunday, then change the dressing daily with sterile 4 x 4 inch gauze dressing and apply TED hose.  You may clean the incision with alcohol prior to redressing.   Do not put a pillow under the knee. Place it under the heel.         DISCHARGE MEDICATIONS:     Medication List     As of 02/04/2012 11:12 AM    STOP taking these medications         sildenafil 100 MG tablet   Commonly known as: VIAGRA      TAKE these medications         ALPRAZolam 0.5 MG tablet   Commonly known as: XANAX   Take 0.5 mg by mouth at bedtime.      CALCIUM 1200 PO   Take 1 tablet by mouth daily.      doxycycline 100 MG tablet   Commonly known as: VIBRA-TABS   Take 1 tablet (100 mg total) by mouth every 12 (twelve) hours.      lisinopril-hydrochlorothiazide 10-12.5 MG per tablet   Commonly known as: PRINZIDE,ZESTORETIC   Take 1 tablet by mouth daily.      methocarbamol 500 MG tablet   Commonly known as: ROBAXIN   Take 1 tablet (500 mg total) by mouth every 6 (six) hours as needed.      oxyCODONE 5 MG immediate release tablet   Commonly known as: Oxy IR/ROXICODONE   Take 1-2 tablets (5-10 mg total) by mouth every 4 (four) hours as needed.      rivaroxaban 10 MG Tabs tablet   Commonly known as: XARELTO   Take 1 tablet (10 mg total) by mouth daily.      Tamsulosin HCl 0.4 MG Caps   Commonly  known as: FLOMAX   Take 0.4 mg by mouth at bedtime.        FOLLOW UP VISIT:       Follow-up Information    Follow up with Valeria Batman, MD. On 02/17/2012.   Contact information:   [address] [place] [address] [phone]          DISPOSITION:  Home    CONDITION:  Stable  PETRARCA,BRIAN 02/04/2012, 11:12 AM

## 2012-02-04 NOTE — Progress Notes (Signed)
I agree with the following treatment note after reviewing documentation.   Johnston, Caesar Mannella Brynn   OTR/L Pager: [phone] Office: [phone] .   

## 2012-02-04 NOTE — Progress Notes (Signed)
Occupational Therapy Discharge Patient Details Name: William Arroyo MRN: [id number] DOB: Jan 12, 1941 Today's Date: 02/04/2012 Time: 4098-1191 OT Time Calculation (min): 11 min  Patient discharged from OT services secondary to Pt. demonstrates modified indepndence- supervision to complete ADL tasks. Pt. has support from wife and daughter once home for any assistance that may be needed..  Please see latest therapy progress note for current level of functioning and progress toward goals.    Progress and discharge plan discussed with patient and/or caregiver: Patient/Caregiver agrees with plan  GO     Cleora Fleet 02/04/2012, 1:43 PM

## 2012-02-04 NOTE — Progress Notes (Signed)
Occupational Therapy Evaluation Patient Details Name: William Arroyo MRN: [id number] DOB: August 24, 1940 Today's Date: 02/04/2012 Time: 7829-5621 OT Time Calculation (min): 11 min  OT Assessment / Plan / Recommendation Clinical Impression  [name] s/p left TKA. Pt is pleasant and tries to be very independent. Pt. demonstrates modified independence-supervison with ADL tasks and will have (A) from wife and daughter once home. No acute OT needs at this time    OT Assessment  Patient does not need any further OT services    Follow Up Recommendations  No OT follow up    Barriers to Discharge      Equipment Recommendations  None recommended by OT    Recommendations for Other Services    Frequency       Precautions / Restrictions Restrictions Weight Bearing Restrictions: Yes LLE Weight Bearing: Weight bearing as tolerated   Pertinent Vitals/Pain None stated    ADL  Grooming: Performed;Wash/dry hands;Supervision/safety Where Assessed - Grooming: Unsupported standing Toilet Transfer: Performed;Modified independent Toilet Transfer Method: Sit to Barista: Regular height toilet Toileting - Clothing Manipulation and Hygiene: Simulated;Supervision/safety Where Assessed - Toileting Clothing Manipulation and Hygiene: Sit to stand from 3-in-1 or toilet Tub/Shower Transfer: Simulated;Modified independent Tub/Shower Transfer Method: Science writer: Walk in shower Equipment Used: Gait belt;Rolling walker Transfers/Ambulation Related to ADLs: Pt. up and ambulating in room using RW. Pt. required supervision for safety during transfers and cues for LLE to be kept out in front when stand<>sit ADL Comments: Pt. able to simulate walk in shower using RW. Pt. transfers to toilet using RW and min verbal cues for safe hand placement. Overall, pt. is supervision to modified independent to complete ADL tasks. Wife and daughter will be able to assist with  any LE dressing once home.     OT Diagnosis:    OT Problem List:   OT Treatment Interventions:     OT Goals    Visit Information  Last OT Received On: 02/04/12 Assistance Needed: +1    Subjective Data  Subjective: Am I good to go home now? Patient Stated Goal: To go home   Prior Functioning     Home Living Lives With: Spouse Available Help at Discharge: Family;Available 24 hours/day Type of Home: House Home Access: Stairs to enter [employer] of Steps: 1 Home Layout: One level Bathroom Shower/Tub: Health visitor: Standard Home Adaptive Equipment: Environmental consultant - four wheeled Prior Function Level of Independence: Independent Able to Take Stairs?: Yes Driving: Yes Vocation: Retired Musician: No difficulties              Cognition  Overall Cognitive Status: Appears within functional limits for tasks assessed/performed Arousal/Alertness: Awake/alert Orientation Level: Appears intact for tasks assessed Behavior During Session: [facility] for tasks performed    Extremity/Trunk Assessment Right Upper Extremity Assessment RUE ROM/Strength/Tone: [facility] for tasks assessed Left Upper Extremity Assessment LUE ROM/Strength/Tone: WFL for tasks assessed     Mobility Bed Mobility Bed Mobility: Not assessed Transfers Transfers: Sit to Stand;Stand to Sit Sit to Stand: 5: Supervision;With upper extremity assist;From toilet Stand to Sit: 5: Supervision;With upper extremity assist;To chair/3-in-1;With armrests Details for Transfer Assistance: VCs to extend L LE forward when sitting          Exercise Total Joint Exercises Ankle Circles/Pumps: AROM;Both;10 reps;Seated Quad Sets: AROM;Left;10 reps;Seated Quad Sets Limitations: Towel roll under ankle. Poor quad activation.  Short Arc QuadBarbaraann Arroyo;Left;10 reps;Seated Heel Slides: AAROM;Left;5 reps;Seated Hip ABduction/ADduction: AAROM;Left;10 reps;Seated Straight Leg Raises: AAROM;Left;10  reps;Seated Long Arc Quad: AAROM;Left;10 reps;Seated        End of Session OT - End of Session Activity Tolerance: Patient tolerated treatment well Patient left: in chair;with call bell/phone within reach;with family/visitor present Nurse Communication: Mobility status  GO     William Arroyo 02/04/2012, 1:43 PM

## 2012-02-04 NOTE — Progress Notes (Signed)
Patient ID: William Arroyo, male   DOB: 1940-09-22, 71 y.o.   MRN: [id number] PATIENT ID: William Arroyo        MRN:  [id number]          DOB/AGE: July 11, 1940 / 71 y.o.    William Campbell, MD   William Code, PA-C [address] [place], [place]  [address]                             [phone]   PROGRESS NOTE  Subjective:  negative for Chest Pain  negative for Shortness of Breath  negative for Nausea/Vomiting   negative for Calf Pain    Tolerating Diet: yes         Patient reports pain as mild.     Doing very well.  Passed PT.  Adamantly wants to go home.  Objective: Vital signs in last 24 hours:   Patient Vitals for the past 24 hrs:  BP Temp Pulse Resp SpO2  02/04/12 0714 97/80 mmHg 98.3 F (36.8 C) 69  16  97 %  02/04/12 0400 - - - 16  -  02/04/12 0000 - - - 16  -  02/03/12 2141 120/76 mmHg 98.3 F (36.8 C) 65  18  100 %  02/03/12 2000 - - - 16  -  02/03/12 1253 107/62 mmHg 98.2 F (36.8 C) 65  18  100 %  02/03/12 1200 - - - 16  97 %      Intake/Output from previous day:   09/25 0701 - 09/26 0700 In: 1500 [P.O.:1200] Out: 1800 [Urine:1800]   Intake/Output this shift:   09/26 0701 - 09/26 1900 In: 240 [P.O.:240] Out: 200 [Urine:200]   Intake/Output      09/25 0701 - 09/26 0700 09/26 0701 - 09/27 0700   P.O. 1200 240   I.V. (mL/kg)     Other     IV Piggyback 300    Total Intake(mL/kg) 1500 (19.5) 240 (3.1)   Urine (mL/kg/hr) 1800 (1) 200 (0.6)   Drains     Other     Blood     Total Output 1800 200   Net -300 +40           LABORATORY DATA:  Basename 02/04/12 0520 02/03/12 0805  WBC 6.7 6.4  HGB 10.6* 10.8*  HCT 31.5* 32.1*  PLT 229 245    Basename 02/04/12 0520 02/03/12 0805  NA 138 133*  K 3.7 3.7  CL 103 98  CO2 26 27  BUN 13 12  CREATININE 1.10 1.04  GLUCOSE 114* 129*  CALCIUM 8.8 8.5   Lab Results  Component Value Date   INR 1.04 01/26/2012    Recent Radiographic Studies :   Chest 2 View  01/26/2012  *RADIOLOGY REPORT*  Clinical  Data: 71 year old male preoperative study for knee surgery.  Hypertension.  CHEST - 2 VIEW  Comparison: None.  Findings: Mild dextroconvex thoracic scoliosis.  Cardiac size and mediastinal contours are within normal limits.  Mild biapical scarring.  No pneumothorax, pulmonary edema, pleural effusion or confluent pulmonary opacity. No acute osseous abnormality identified.  IMPRESSION: No acute cardiopulmonary abnormality.   Original Report Authenticated By: Harley Hallmark, M.D.      Examination:  General appearance: alert, cooperative and mild distress Resp: clear to auscultation bilaterally Cardio: regular rate and rhythm GI: normal findings: bowel sounds normal  Wound Exam: clean, dry, intact   Drainage:  None: wound tissue  dry  Motor Exam: EHL, FHL, Anterior Tibial and Posterior Tibial Intact  Sensory Exam: Superficial Peroneal, Deep Peroneal and Tibial normal  Vascular Exam: Left dorsalis pedis artery has 1+ (weak) pulse  Assessment:    2 Days Post-Op  Procedure(s) (LRB): TOTAL KNEE REVISION (Left)  ADDITIONAL DIAGNOSIS:  Active Problems:  * No active hospital problems. *   Acute Blood Loss Anemia asymptomatic   Plan: Physical Therapy as ordered Partial Weight Bearing @ 50% (PWB)  DVT Prophylaxis:  Xarelto, Foot Pumps and TED hose  DISCHARGE PLAN: Home today  DISCHARGE NEEDS: HHPT, CPM, Walker and 3-in-1 comode seat         Kross Swallows 02/04/2012 11:02 AM

## 2012-02-05 LAB — BODY FLUID CULTURE: Culture: NO GROWTH

## 2012-02-07 LAB — ANAEROBIC CULTURE

## 2012-02-09 NOTE — Discharge Summary (Signed)
Tried several times to cosign this note

## 2014-04-05 IMAGING — CR DG CHEST 2V
2 series · 2 of 2 positions shown · non-contrast
Comparison: None.

CLINICAL DATA: 71-year-old male preoperative study for knee
surgery.  Hypertension.

CHEST - 2 VIEW

[view not recorded (1 of 2)]
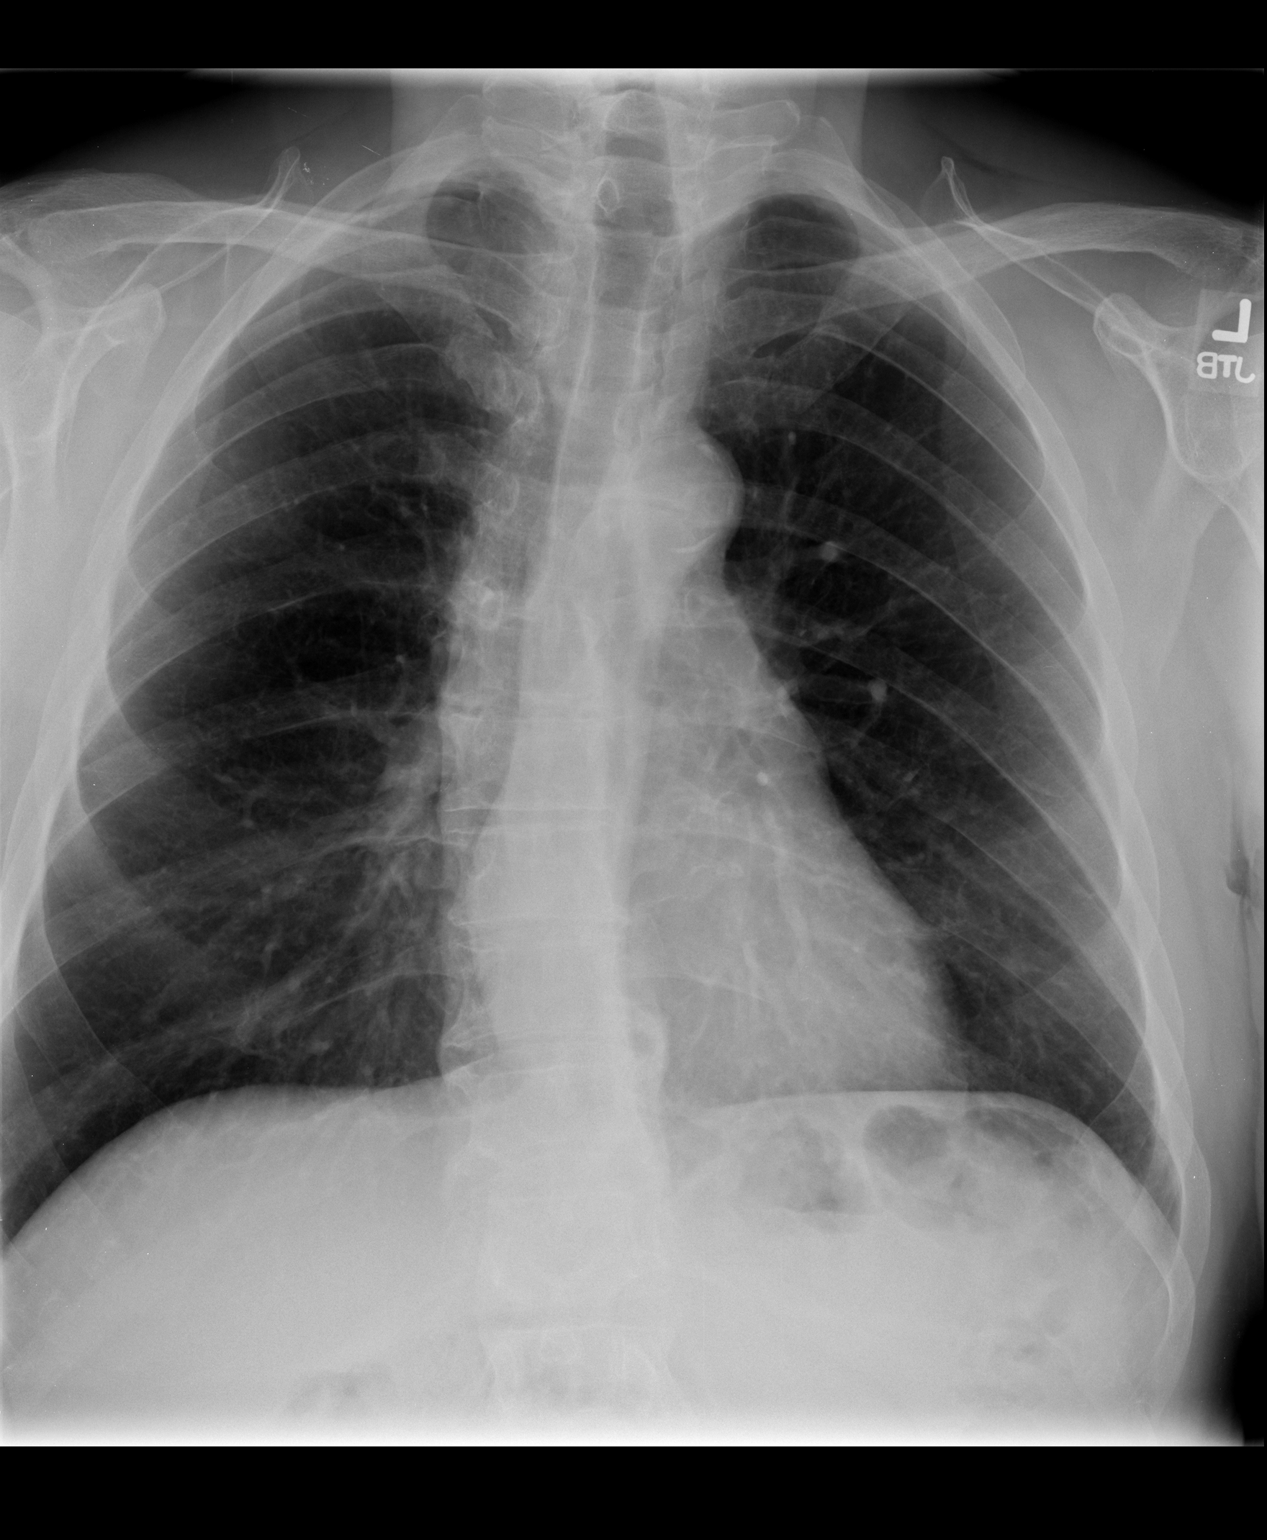

[view not recorded (2 of 2)]
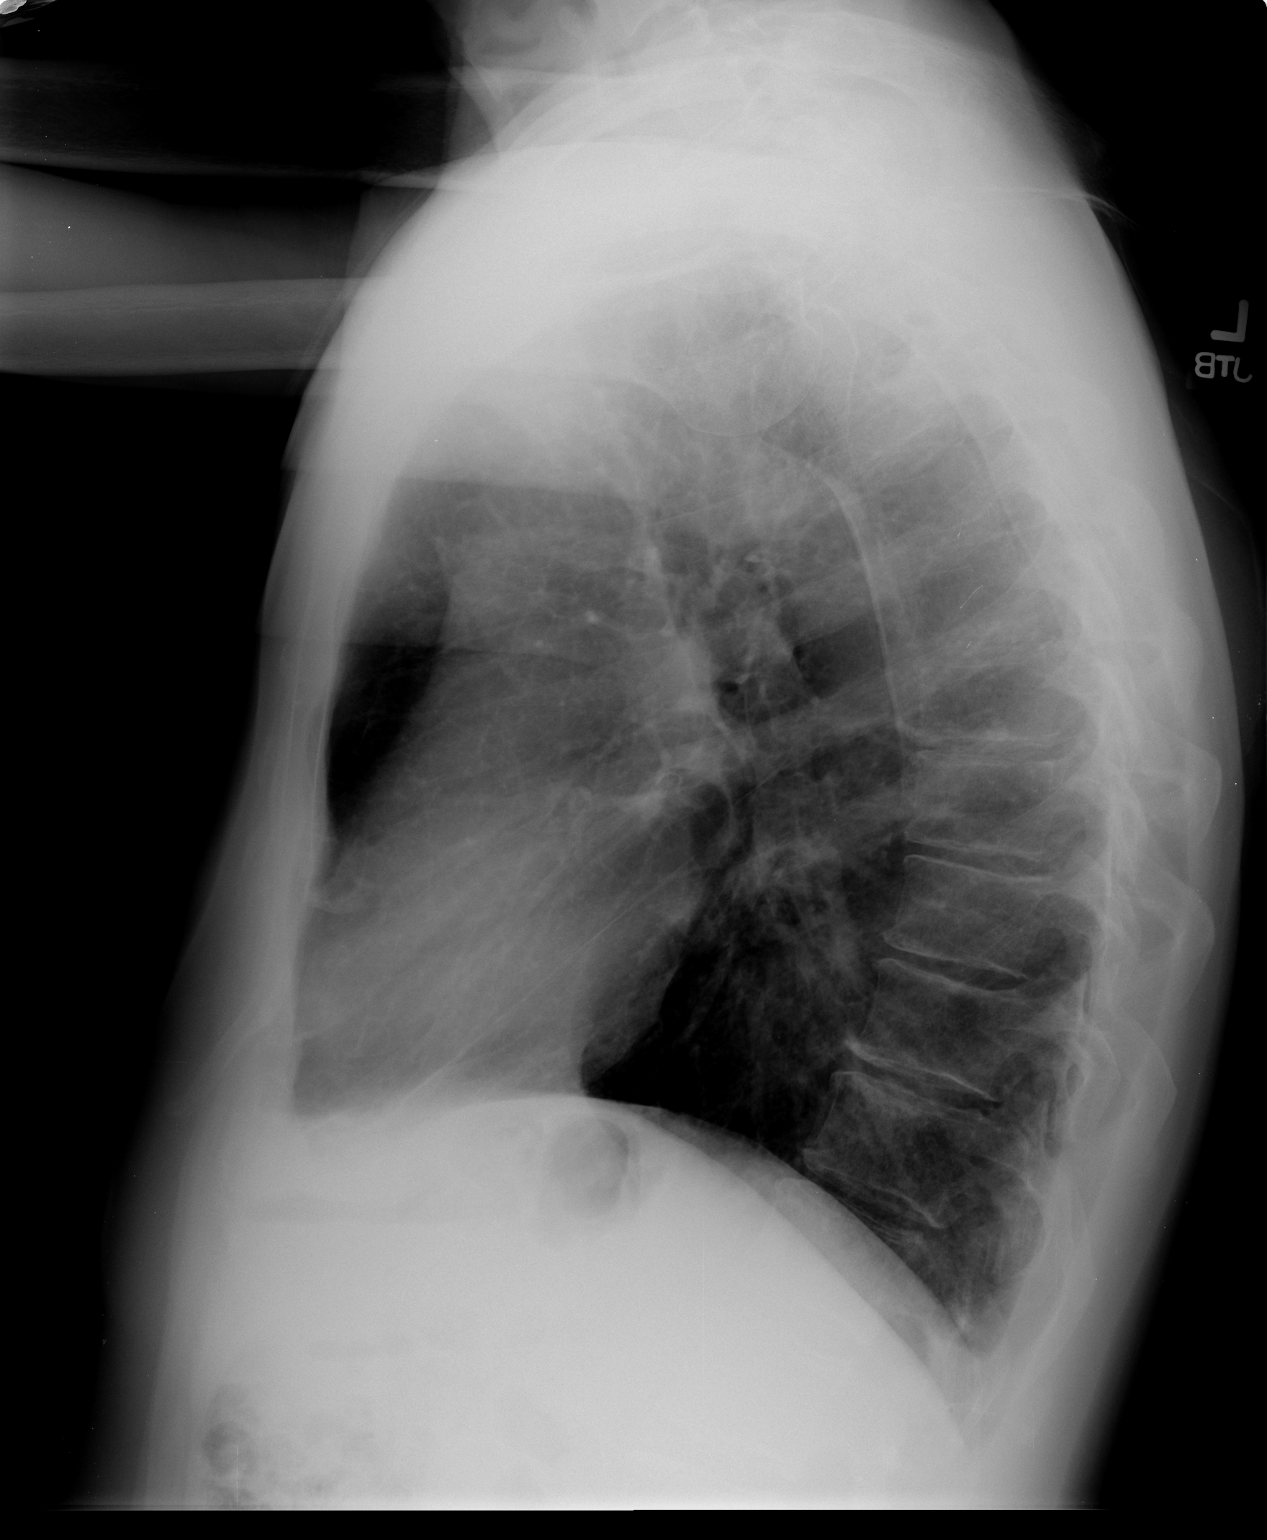

[2 of 2 positions shown; findings below may reference images not displayed]

FINDINGS: Mild dextroconvex thoracic scoliosis.  Cardiac size and
mediastinal contours are within normal limits.  Mild biapical
scarring.  No pneumothorax, pulmonary edema, pleural effusion or
confluent pulmonary opacity. No acute osseous abnormality
identified.
IMPRESSION: No acute cardiopulmonary abnormality.

## 2016-01-29 ENCOUNTER — Telehealth: Payer: Self-pay | Admitting: Cardiovascular Disease

## 2016-01-29 NOTE — Telephone Encounter (Signed)
Received records from [facility] for appointment on 02/14/16 with Dr Duke Salvia.  Records given to [facility] (medical records) for Dr Leonides Sake schedule on 02/14/16. lp

## 2016-02-04 ENCOUNTER — Telehealth: Payer: Self-pay | Admitting: Cardiovascular Disease

## 2016-02-04 NOTE — Telephone Encounter (Signed)
New message    Pts wife calling stating that the pt had a stress test done in the hosp and would like to fax the results but they need a letterhead sheet faxed to them showing that this is a practice in order to release them. Please fax to Medical Records Dept @ [phone].

## 2016-02-06 NOTE — Telephone Encounter (Signed)
Sent a request 9/26 as requested. Not received as of today

## 2016-02-07 ENCOUNTER — Telehealth: Payer: Self-pay | Admitting: Cardiovascular Disease

## 2016-02-07 NOTE — Telephone Encounter (Signed)
Records received from [facility] for apt on 02/14/16 with Dr Duke Salvia. Records given to [employer] (medical records) CN

## 2016-02-13 NOTE — Telephone Encounter (Signed)
Received and has ov tomorrow

## 2016-02-14 ENCOUNTER — Encounter: Payer: Self-pay | Admitting: Cardiovascular Disease

## 2016-02-14 ENCOUNTER — Ambulatory Visit (INDEPENDENT_AMBULATORY_CARE_PROVIDER_SITE_OTHER): Payer: Medicare Other | Admitting: Cardiovascular Disease

## 2016-02-14 VITALS — BP 140/60 | HR 82 | Ht 70.5 in | Wt 165.6 lb

## 2016-02-14 DIAGNOSIS — I10 Essential (primary) hypertension: Secondary | ICD-10-CM

## 2016-02-14 DIAGNOSIS — E7800 Pure hypercholesterolemia, unspecified: Secondary | ICD-10-CM

## 2016-02-14 DIAGNOSIS — R5383 Other fatigue: Secondary | ICD-10-CM

## 2016-02-14 DIAGNOSIS — R079 Chest pain, unspecified: Secondary | ICD-10-CM | POA: Diagnosis not present

## 2016-02-14 DIAGNOSIS — E785 Hyperlipidemia, unspecified: Secondary | ICD-10-CM

## 2016-02-14 DIAGNOSIS — R9439 Abnormal result of other cardiovascular function study: Secondary | ICD-10-CM

## 2016-02-14 DIAGNOSIS — R9430 Abnormal result of cardiovascular function study, unspecified: Secondary | ICD-10-CM | POA: Diagnosis not present

## 2016-02-14 HISTORY — DX: Other fatigue: R53.83

## 2016-02-14 HISTORY — DX: Essential (primary) hypertension: I10

## 2016-02-14 HISTORY — DX: Hyperlipidemia, unspecified: E78.5

## 2016-02-14 LAB — BASIC METABOLIC PANEL
BUN: 16 mg/dL (ref 7–25)
CALCIUM: 9.5 mg/dL (ref 8.6–10.3)
CO2: 24 mmol/L (ref 20–31)
CREATININE: 1.44 mg/dL — AB (ref 0.70–1.18)
Chloride: 104 mmol/L (ref 98–110)
GLUCOSE: 96 mg/dL (ref 65–99)
POTASSIUM: 4 mmol/L (ref 3.5–5.3)
Sodium: 138 mmol/L (ref 135–146)

## 2016-02-14 LAB — CBC
HCT: 40.2 % (ref 38.5–50.0)
Hemoglobin: 13.3 g/dL (ref 13.2–17.1)
MCH: 27.7 pg (ref 27.0–33.0)
MCHC: 33.1 g/dL (ref 32.0–36.0)
MCV: 83.6 fL (ref 80.0–100.0)
MPV: 9 fL (ref 7.5–12.5)
PLATELETS: 339 10*3/uL (ref 140–400)
RBC: 4.81 MIL/uL (ref 4.20–5.80)
RDW: 14.7 % (ref 11.0–15.0)
WBC: 7.2 10*3/uL (ref 3.8–10.8)

## 2016-02-14 NOTE — Patient Instructions (Addendum)
Medication Instructions:  Your physician recommends that you continue on your current medications as directed. Please refer to the Current Medication list given to you today.  Labwork: Your physician recommends that you have lab work today: BMP, CBC and PT/INR  Testing/Procedures: Your physician has requested that you have a cardiac catheterization. Cardiac catheterization is used to diagnose and/or treat various heart conditions. Doctors may recommend this procedure for a number of different reasons. The most common reason is to evaluate chest pain. Chest pain can be a symptom of coronary artery disease (CAD), and cardiac catheterization can show whether plaque is narrowing or blocking your heart's arteries. This procedure is also used to evaluate the valves, as well as measure the blood flow and oxygen levels in different parts of your heart. For further information please visit [email or web address] Please follow instruction sheet, as given.  Follow-Up: Your physician recommends that you schedule a follow-up appointment in:1 MONTH with Dr Duke Salvia   Any Other Special Instructions Will Be Listed Below (If Applicable).     If you need a refill on your cardiac medications before your next appointment, please call your pharmacy.

## 2016-02-14 NOTE — Progress Notes (Signed)
Cardiology Office Note   Date:  02/14/2016   ID:  William Arroyo, DOB 1941/02/05, MRN [id number]  PCP:  Pcp Not In System  Cardiologist:   Chilton Si, MD   Chief Complaint  Arroyo presents with  . New Arroyo (Initial Visit)    chest discomfort, fatigue with exertion      History of Present Illness: William Arroyo is a 75 y.o. male with hypertension who presents for an evaluation of chest pain. William Arroyo saw Dr. Alton Revere and reported Exertional fatigue. This is been an ongoing issue but seems to be getting worse lately. He is very physically active and does a lot of work around his house including cutting wood, mowing William lawn, and working in William garden. He notes that lately after working for 20 minutes he gets exhausted and has to stop to rest. He denies any chest pain or shortness of breath he also hasn't noted any lower extremity edema, orthopnea, PND or palpitations.  He does sometimes get lightheaded when standing up quickly but denies syncope. He was referred for an exercise Cardiolite stress test on 01/06/16 and achieved 7 METS on a Bruce protocol.  There was concern for inferior ischemia, though there was also a possibility of diaphragm attenuation.  It was recommended that he have a cardiac catheterization and he presents today to begin William process.   Past Medical History:  Diagnosis Date  . Arthritis   . Essential hypertension 02/14/2016  . Fatigue 02/14/2016  . Hyperlipidemia 02/14/2016  . Hypertension   . Prostate enlargement     Past Surgical History:  Procedure Laterality Date  . HERNIA REPAIR    . JOINT REPLACEMENT    . TOTAL KNEE REVISION  02/02/2012   Procedure: TOTAL KNEE REVISION;  Surgeon: Valeria Batman, MD;  Location: [facility] OR;  Service: Orthopedics;  Laterality: Left;  Exploration Left Total knee replacement ,synovectomy and poly exchange articular and patella     Current Outpatient Prescriptions  Medication Sig Dispense Refill  . ALPRAZolam  (XANAX) 1 MG tablet Take 1 mg by mouth daily.    Marland Kitchen aspirin EC 81 MG tablet Take 81 mg by mouth daily.    . Calcium Carbonate-Vit D-Min (CALCIUM 1200 PO) Take 1 tablet by mouth daily.     . hydroxychloroquine (PLAQUENIL) 200 MG tablet Take 200 mg by mouth 2 (two) times daily.    Marland Kitchen lisinopril-hydrochlorothiazide (PRINZIDE,ZESTORETIC) 20-12.5 MG tablet Take 1 tablet by mouth every morning.  1  . sildenafil (VIAGRA) 100 MG tablet Take 100 mg by mouth once a week.    . simvastatin (ZOCOR) 40 MG tablet Take 40 mg by mouth daily.    . Tamsulosin HCl (FLOMAX) 0.4 MG CAPS Take 0.4 mg by mouth at bedtime.    . Vitamin D, Ergocalciferol, (DRISDOL) 50000 units CAPS capsule Take 50,000 Units by mouth once a week.  12   No current facility-administered medications for this visit.     Allergies:   Methotrexate derivatives    Social History:  William Arroyo  reports that he has quit smoking. He has never used smokeless tobacco. He reports that he does not drink alcohol or use drugs.   Family History:  William Arroyo's family history includes Diabetes in his brother; Stomach cancer in his sister.    ROS:  Please see William history of present illness.   Otherwise, review of systems are positive for none.   All other systems are reviewed and negative.    PHYSICAL  EXAM: VS:  BP 140/60   Pulse 82   Ht 5' 10.5" (1.791 m)   Wt 165 lb 9.6 oz (75.1 kg)   BMI 23.43 kg/m  , BMI Body mass index is 23.43 kg/m. GENERAL:  Well appearing HEENT:  Pupils equal round and reactive, fundi not visualized, oral mucosa unremarkable NECK:  No jugular venous distention, waveform within normal limits, carotid upstroke brisk and symmetric, no bruits, no thyromegaly LYMPHATICS:  No cervical adenopathy LUNGS:  Clear to auscultation bilaterally HEART:  RRR.  PMI not displaced or sustained,S1 and S2 within normal limits, no S3, no S4, no clicks, no rubs, no murmurs ABD:  Flat, positive bowel sounds normal in frequency in pitch, no  bruits, no rebound, no guarding, no midline pulsatile mass, no hepatomegaly, no splenomegaly EXT:  2 plus pulses throughout, no edema, no cyanosis no clubbing SKIN:  No rashes no nodules NEURO:  Cranial nerves II through XII grossly intact, motor grossly intact throughout PSYCH:  Cognitively intact, oriented to person place and time   EKG:  EKG is ordered today. William ekg ordered today demonstrates sinus rhythm. Rate 80 bpm.  RBBB.    Recent Labs: No results found for requested labs within last 8760 hours.   01/02/16 WBC 7, hemoglobin 12.3, hematocrit 37.3, platelet 343 Sodium 137, potassium 4.6, UN 19, creatinine 1.44 Total cholesterol 144, triglycerides 79, HDL 45, LDL 83 TSH 1.68, free T4 1 0.42  Lipid Panel No results found for: CHOL, TRIG, HDL, CHOLHDL, VLDL, LDLCALC, LDLDIRECT    Wt Readings from Last 3 Encounters:  02/14/16 165 lb 9.6 oz (75.1 kg)  02/02/12 170 lb (77.1 kg)  01/26/12 170 lb (77.1 kg)      ASSESSMENT AND PLAN:  # Abnormal stress test: William Arroyo had an abnormal stress test and reports exertional fatigue.  We will arrange for a cardiac catheterization next Thursday with Dr. and. We will obtain basic metabolic panel, CBC, and coags today.   Continue aspirin and simvastatin.  # Hypertension: Blood pressure well-controlled. Continue lisinopril and hydrochlorothiazide.  # Hyperlipidemia: Continue simvastatin. LDL 83 12/2015     Current medicines are reviewed at length with William Arroyo today.  William Arroyo does not have concerns regarding medicines.  William following changes have been made:  no change  Labs/ tests ordered today include:   Orders Placed This Encounter  Procedures  . Basic metabolic panel  . CBC  . INR/PT  . EKG 12-Lead     Disposition:   FU with Che Below C. Duke Salvia, MD, [facility] in 1 month.     This note was written with William assistance of speech recognition software.  Please excuse any transcriptional errors.  Signed, Ermias Tomeo C.  Duke Salvia, MD, [facility]  02/14/2016 3:25 PM    [facility]

## 2016-02-15 LAB — PROTIME-INR
INR: 1
PROTHROMBIN TIME: 10.7 s (ref 9.0–11.5)

## 2016-02-20 ENCOUNTER — Encounter (HOSPITAL_COMMUNITY)
Admission: RE | Disposition: A | Discharge status: Home / Self Care | Payer: Self-pay | Source: Ambulatory Visit | Attending: Internal Medicine

## 2016-02-20 ENCOUNTER — Ambulatory Visit (HOSPITAL_COMMUNITY)
Admission: RE | Admit: 2016-02-20 | Discharge: 2016-02-20 | Disposition: A | Discharge status: Home / Self Care | Payer: Medicare Other | Source: Ambulatory Visit | Attending: Internal Medicine | Admitting: Internal Medicine

## 2016-02-20 DIAGNOSIS — Z7982 Long term (current) use of aspirin: Secondary | ICD-10-CM | POA: Insufficient documentation

## 2016-02-20 DIAGNOSIS — Z96652 Presence of left artificial knee joint: Secondary | ICD-10-CM | POA: Diagnosis not present

## 2016-02-20 DIAGNOSIS — Z833 Family history of diabetes mellitus: Secondary | ICD-10-CM | POA: Diagnosis not present

## 2016-02-20 DIAGNOSIS — N400 Enlarged prostate without lower urinary tract symptoms: Secondary | ICD-10-CM | POA: Diagnosis not present

## 2016-02-20 DIAGNOSIS — I2510 Atherosclerotic heart disease of native coronary artery without angina pectoris: Secondary | ICD-10-CM | POA: Diagnosis not present

## 2016-02-20 DIAGNOSIS — R5383 Other fatigue: Secondary | ICD-10-CM | POA: Diagnosis not present

## 2016-02-20 DIAGNOSIS — I4510 Unspecified right bundle-branch block: Secondary | ICD-10-CM | POA: Insufficient documentation

## 2016-02-20 DIAGNOSIS — M069 Rheumatoid arthritis, unspecified: Secondary | ICD-10-CM | POA: Diagnosis not present

## 2016-02-20 DIAGNOSIS — R9430 Abnormal result of cardiovascular function study, unspecified: Secondary | ICD-10-CM

## 2016-02-20 DIAGNOSIS — Z800 Family history of malignant neoplasm of digestive organs: Secondary | ICD-10-CM | POA: Diagnosis not present

## 2016-02-20 DIAGNOSIS — Z87891 Personal history of nicotine dependence: Secondary | ICD-10-CM | POA: Insufficient documentation

## 2016-02-20 DIAGNOSIS — I10 Essential (primary) hypertension: Secondary | ICD-10-CM | POA: Insufficient documentation

## 2016-02-20 DIAGNOSIS — R9439 Abnormal result of other cardiovascular function study: Secondary | ICD-10-CM | POA: Diagnosis present

## 2016-02-20 DIAGNOSIS — I2584 Coronary atherosclerosis due to calcified coronary lesion: Secondary | ICD-10-CM | POA: Insufficient documentation

## 2016-02-20 DIAGNOSIS — E785 Hyperlipidemia, unspecified: Secondary | ICD-10-CM | POA: Diagnosis not present

## 2016-02-20 HISTORY — PX: CARDIAC CATHETERIZATION: SHX172

## 2016-02-20 SURGERY — LEFT HEART CATH AND CORONARY ANGIOGRAPHY
Anesthesia: LOCAL

## 2016-02-20 MED ORDER — MIDAZOLAM HCL 2 MG/2ML IJ SOLN
INTRAMUSCULAR | Status: DC | PRN
  Administered 2016-02-20: 1 mg via INTRAVENOUS

## 2016-02-20 MED ORDER — MIDAZOLAM HCL 2 MG/2ML IJ SOLN
INTRAMUSCULAR | Status: AC
  Filled 2016-02-20: qty 2

## 2016-02-20 MED ORDER — SODIUM CHLORIDE 0.9 % WEIGHT BASED INFUSION: 1.0000 mL/kg/h | INTRAVENOUS | Status: DC

## 2016-02-20 MED ORDER — VERAPAMIL HCL 2.5 MG/ML IV SOLN
INTRAVENOUS | Status: DC | PRN
  Administered 2016-02-20: 10 mL via INTRA_ARTERIAL

## 2016-02-20 MED ORDER — VERAPAMIL HCL 2.5 MG/ML IV SOLN
INTRAVENOUS | Status: AC
  Filled 2016-02-20: qty 2

## 2016-02-20 MED ORDER — SODIUM CHLORIDE 0.9 % IV SOLN: 250.0000 mL | INTRAVENOUS | Status: DC | PRN

## 2016-02-20 MED ORDER — SODIUM CHLORIDE 0.9 % WEIGHT BASED INFUSION
3.0000 mL/kg/h | INTRAVENOUS | Status: AC
  Administered 2016-02-20: 3 mL/kg/h via INTRAVENOUS

## 2016-02-20 MED ORDER — ASPIRIN 81 MG PO CHEW
CHEWABLE_TABLET | ORAL | Status: AC
  Administered 2016-02-20: 81 mg via ORAL
  Filled 2016-02-20: qty 1

## 2016-02-20 MED ORDER — SODIUM CHLORIDE 0.9% FLUSH
3.0000 mL | INTRAVENOUS | Status: DC | PRN
Start: 2016-02-20 — End: 2016-02-20

## 2016-02-20 MED ORDER — ASPIRIN 81 MG PO CHEW
81.0000 mg | CHEWABLE_TABLET | ORAL | Status: AC
  Administered 2016-02-20: 81 mg via ORAL

## 2016-02-20 MED ORDER — HEPARIN (PORCINE) IN NACL 2-0.9 UNIT/ML-% IJ SOLN
INTRAMUSCULAR | Status: AC
  Filled 2016-02-20: qty 500

## 2016-02-20 MED ORDER — SODIUM CHLORIDE 0.9 % IV SOLN: INTRAVENOUS | Status: DC

## 2016-02-20 MED ORDER — SODIUM CHLORIDE 0.9% FLUSH: 3.0000 mL | Freq: Two times a day (BID) | INTRAVENOUS | Status: DC

## 2016-02-20 MED ORDER — HEPARIN SODIUM (PORCINE) 1000 UNIT/ML IJ SOLN
INTRAMUSCULAR | Status: DC | PRN
  Administered 2016-02-20: 4000 [IU] via INTRAVENOUS

## 2016-02-20 MED ORDER — FENTANYL CITRATE (PF) 100 MCG/2ML IJ SOLN
INTRAMUSCULAR | Status: AC
  Filled 2016-02-20: qty 2

## 2016-02-20 MED ORDER — IOPAMIDOL (ISOVUE-370) INJECTION 76%
INTRAVENOUS | Status: DC | PRN
  Administered 2016-02-20: 30 mL via INTRA_ARTERIAL

## 2016-02-20 MED ORDER — LIDOCAINE HCL (PF) 1 % IJ SOLN
INTRAMUSCULAR | Status: DC | PRN
  Administered 2016-02-20: 2 mL

## 2016-02-20 MED ORDER — LIDOCAINE HCL (PF) 1 % IJ SOLN
INTRAMUSCULAR | Status: AC
  Filled 2016-02-20: qty 30

## 2016-02-20 MED ORDER — HEPARIN SODIUM (PORCINE) 1000 UNIT/ML IJ SOLN
INTRAMUSCULAR | Status: AC
  Filled 2016-02-20: qty 1

## 2016-02-20 MED ORDER — IOPAMIDOL (ISOVUE-370) INJECTION 76%
INTRAVENOUS | Status: AC
  Filled 2016-02-20: qty 100

## 2016-02-20 MED ORDER — FENTANYL CITRATE (PF) 100 MCG/2ML IJ SOLN
INTRAMUSCULAR | Status: DC | PRN
  Administered 2016-02-20: 25 ug via INTRAVENOUS

## 2016-02-20 MED ORDER — SODIUM CHLORIDE 0.9% FLUSH: 3.0000 mL | INTRAVENOUS | Status: DC | PRN

## 2016-02-20 MED ORDER — HEPARIN (PORCINE) IN NACL 2-0.9 UNIT/ML-% IJ SOLN
INTRAMUSCULAR | Status: DC | PRN
  Administered 2016-02-20: 500 mL

## 2016-02-20 SURGICAL SUPPLY — 11 items
CATH IMPULSE 5F ANG/FL3.5 (CATHETERS) ×2 IMPLANT
DEVICE RAD COMP TR BAND LRG (VASCULAR PRODUCTS) ×2 IMPLANT
GLIDESHEATH INTRODUCER 6F 10CM (SHEATH) ×2 IMPLANT
KIT HEART LEFT (KITS) ×2 IMPLANT
NEEDLE PERC 21GX4CM (NEEDLE) ×2 IMPLANT
PACK CARDIAC CATHETERIZATION (CUSTOM PROCEDURE TRAY) ×2 IMPLANT
SYR MEDRAD MARK V 150ML (SYRINGE) ×2 IMPLANT
TRANSDUCER W/STOPCOCK (MISCELLANEOUS) ×2 IMPLANT
TUBING CIL FLEX 10 FLL-RA (TUBING) ×2 IMPLANT
WIRE HI TORQ VERSACORE-J 145CM (WIRE) ×2 IMPLANT
WIRE SAFE-T 1.5MM-J .035X260CM (WIRE) ×2 IMPLANT

## 2016-02-20 NOTE — H&P (View-Only) (Signed)
Cardiology Office Note   Date:  02/14/2016   ID:  William Arroyo, DOB 1941/02/05, MRN [id number]  PCP:  Pcp Not In System  Cardiologist:   Chilton Si, MD   Chief Complaint  Patient presents with  . New Patient (Initial Visit)    chest discomfort, fatigue with exertion      History of Present Illness: William Arroyo is a 75 y.o. male with hypertension who presents for an evaluation of chest pain. William Arroyo saw Dr. Alton Revere and reported Exertional fatigue. This is been an ongoing issue but seems to be getting worse lately. He is very physically active and does a lot of work around his house including cutting wood, mowing the lawn, and working in the garden. He notes that lately after working for 20 minutes he gets exhausted and has to stop to rest. He denies any chest pain or shortness of breath he also hasn't noted any lower extremity edema, orthopnea, PND or palpitations.  He does sometimes get lightheaded when standing up quickly but denies syncope. He was referred for an exercise Cardiolite stress test on 01/06/16 and achieved 7 METS on a Bruce protocol.  There was concern for inferior ischemia, though there was also a possibility of diaphragm attenuation.  It was recommended that he have a cardiac catheterization and he presents today to begin the process.   Past Medical History:  Diagnosis Date  . Arthritis   . Essential hypertension 02/14/2016  . Fatigue 02/14/2016  . Hyperlipidemia 02/14/2016  . Hypertension   . Prostate enlargement     Past Surgical History:  Procedure Laterality Date  . HERNIA REPAIR    . JOINT REPLACEMENT    . TOTAL KNEE REVISION  02/02/2012   Procedure: TOTAL KNEE REVISION;  Surgeon: Valeria Batman, MD;  Location: [facility] OR;  Service: Orthopedics;  Laterality: Left;  Exploration Left Total knee replacement ,synovectomy and poly exchange articular and patella     Current Outpatient Prescriptions  Medication Sig Dispense Refill  . ALPRAZolam  (XANAX) 1 MG tablet Take 1 mg by mouth daily.    Marland Kitchen aspirin EC 81 MG tablet Take 81 mg by mouth daily.    . Calcium Carbonate-Vit D-Min (CALCIUM 1200 PO) Take 1 tablet by mouth daily.     . hydroxychloroquine (PLAQUENIL) 200 MG tablet Take 200 mg by mouth 2 (two) times daily.    Marland Kitchen lisinopril-hydrochlorothiazide (PRINZIDE,ZESTORETIC) 20-12.5 MG tablet Take 1 tablet by mouth every morning.  1  . sildenafil (VIAGRA) 100 MG tablet Take 100 mg by mouth once a week.    . simvastatin (ZOCOR) 40 MG tablet Take 40 mg by mouth daily.    . Tamsulosin HCl (FLOMAX) 0.4 MG CAPS Take 0.4 mg by mouth at bedtime.    . Vitamin D, Ergocalciferol, (DRISDOL) 50000 units CAPS capsule Take 50,000 Units by mouth once a week.  12   No current facility-administered medications for this visit.     Allergies:   Methotrexate derivatives    Social History:  The patient  reports that he has quit smoking. He has never used smokeless tobacco. He reports that he does not drink alcohol or use drugs.   Family History:  The patient's family history includes Diabetes in his brother; Stomach cancer in his sister.    ROS:  Please see the history of present illness.   Otherwise, review of systems are positive for none.   All other systems are reviewed and negative.    PHYSICAL  EXAM: VS:  BP 140/60   Pulse 82   Ht 5' 10.5" (1.791 m)   Wt 165 lb 9.6 oz (75.1 kg)   BMI 23.43 kg/m  , BMI Body mass index is 23.43 kg/m. GENERAL:  Well appearing HEENT:  Pupils equal round and reactive, fundi not visualized, oral mucosa unremarkable NECK:  No jugular venous distention, waveform within normal limits, carotid upstroke brisk and symmetric, no bruits, no thyromegaly LYMPHATICS:  No cervical adenopathy LUNGS:  Clear to auscultation bilaterally HEART:  RRR.  PMI not displaced or sustained,S1 and S2 within normal limits, no S3, no S4, no clicks, no rubs, no murmurs ABD:  Flat, positive bowel sounds normal in frequency in pitch, no  bruits, no rebound, no guarding, no midline pulsatile mass, no hepatomegaly, no splenomegaly EXT:  2 plus pulses throughout, no edema, no cyanosis no clubbing SKIN:  No rashes no nodules NEURO:  Cranial nerves II through XII grossly intact, motor grossly intact throughout PSYCH:  Cognitively intact, oriented to person place and time   EKG:  EKG is ordered today. The ekg ordered today demonstrates sinus rhythm. Rate 80 bpm.  RBBB.    Recent Labs: No results found for requested labs within last 8760 hours.   01/02/16 WBC 7, hemoglobin 12.3, hematocrit 37.3, platelet 343 Sodium 137, potassium 4.6, UN 19, creatinine 1.44 Total cholesterol 144, triglycerides 79, HDL 45, LDL 83 TSH 1.68, free T4 1 0.42  Lipid Panel No results found for: CHOL, TRIG, HDL, CHOLHDL, VLDL, LDLCALC, LDLDIRECT    Wt Readings from Last 3 Encounters:  02/14/16 165 lb 9.6 oz (75.1 kg)  02/02/12 170 lb (77.1 kg)  01/26/12 170 lb (77.1 kg)      ASSESSMENT AND PLAN:  # Abnormal stress test: William Arroyo had an abnormal stress test and reports exertional fatigue.  We will arrange for a cardiac catheterization next Thursday with Dr. and. We will obtain basic metabolic panel, CBC, and coags today.   Continue aspirin and simvastatin.  # Hypertension: Blood pressure well-controlled. Continue lisinopril and hydrochlorothiazide.  # Hyperlipidemia: Continue simvastatin. LDL 83 12/2015     Current medicines are reviewed at length with the patient today.  The patient does not have concerns regarding medicines.  The following changes have been made:  no change  Labs/ tests ordered today include:   Orders Placed This Encounter  Procedures  . Basic metabolic panel  . CBC  . INR/PT  . EKG 12-Lead     Disposition:   FU with Kathi Dohn C. Loomis, MD, FACC in 1 month.     This note was written with the assistance of speech recognition software.  Please excuse any transcriptional errors.  Signed, Summerlynn Glauser C.  [place], MD, FACC  02/14/2016 3:25 PM    [facility] 

## 2016-02-20 NOTE — Discharge Instructions (Signed)
Radial Site Care °Refer to this sheet in the next few weeks. These instructions provide you with information about caring for yourself after your procedure. Your health care provider may also give you more specific instructions. Your treatment has been planned according to current medical practices, but problems sometimes occur. Call your health care provider if you have any problems or questions after your procedure. °WHAT TO EXPECT AFTER THE PROCEDURE °After your procedure, it is typical to have the following: °· Bruising at the radial site that usually fades within 1-2 weeks. °· Blood collecting in the tissue (hematoma) that may be painful to the touch. It should usually decrease in size and tenderness within 1-2 weeks. °HOME CARE INSTRUCTIONS °· Take medicines only as directed by your health care provider. °· You may shower 24-48 hours after the procedure or as directed by your health care provider. Remove the bandage (dressing) and gently wash the site with plain soap and water. Pat the area dry with a clean towel. Do not rub the site, because this may cause bleeding. °· Do not take baths, swim, or use a hot tub until your health care provider approves. °· Check your insertion site every day for redness, swelling, or drainage. °· Do not apply powder or lotion to the site. °· Do not flex or bend the affected arm for 24 hours or as directed by your health care provider. °· Do not push or pull heavy objects with the affected arm for 24 hours or as directed by your health care provider. °· Do not lift over 10 lb (4.5 kg) for 5 days after your procedure or as directed by your health care provider. °· Ask your health care provider when it is okay to: °¨ Return to work or school. °¨ Resume usual physical activities or sports. °¨ Resume sexual activity. °· Do not drive home if you are discharged the same day as the procedure. Have someone else drive you. °· You may drive 24 hours after the procedure unless otherwise  instructed by your health care provider. °· Do not operate machinery or power tools for 24 hours after the procedure. °· If your procedure was done as an outpatient procedure, which means that you went home the same day as your procedure, a responsible adult should be with you for the first 24 hours after you arrive home. °· Keep all follow-up visits as directed by your health care provider. This is important. °SEEK MEDICAL CARE IF: °· You have a fever. °· You have chills. °· You have increased bleeding from the radial site. Hold pressure on the site. °SEEK IMMEDIATE MEDICAL CARE IF: °· You have unusual pain at the radial site. °· You have redness, warmth, or swelling at the radial site. °· You have drainage (other than a small amount of blood on the dressing) from the radial site. °· The radial site is bleeding, and the bleeding does not stop after 30 minutes of holding steady pressure on the site. °· Your arm or hand becomes pale, cool, tingly, or numb. °  °This information is not intended to replace advice given to you by your health care provider. Make sure you discuss any questions you have with your health care provider. °  °Document Released: 05/30/2010 Document Revised: 05/18/2014 Document Reviewed: 11/13/2013 °Elsevier Interactive Patient Education ©2016 [employer] ° °

## 2016-02-20 NOTE — Research (Signed)
CADLAD Informed Consent   Subject Name: William Arroyo  Subject met inclusion and exclusion criteria.  The informed consent form, study requirements and expectations were reviewed with the subject and questions and concerns were addressed prior to the signing of the consent form.  The subject verbalized understanding of the trail requirements.  The subject agreed to participate in theCADLAD trial and signed the informed consent.  The informed consent was obtained prior to performance of any protocol-specific procedures for the subject.  A copy of the signed informed consent was given to the subject and a copy was placed in the subject's medical record.  Sandie Ano 02/20/2016, 8:50

## 2016-02-20 NOTE — Interval H&P Note (Signed)
History and Physical Interval Note:  02/20/2016 11:32 AM  William Arroyo  has presented today for surgery, with the diagnosis of abnormal stress test  The various methods of treatment have been discussed with the patient and family. After consideration of risks, benefits and other options for treatment, the patient has consented to  Procedure(s): Left Heart Cath and Coronary Angiography (N/A) as a surgical intervention .  The patient's history has been reviewed, patient examined, no change in status, stable for surgery.  I have reviewed the patient's chart and labs.  Questions were answered to the patient's satisfaction.    Cath Lab Visit (complete for each Cath Lab visit)  Clinical Evaluation Leading to the Procedure:   ACS: No.  Non-ACS:    Anginal Classification: CCS II  Anti-ischemic medical therapy: No Therapy  Non-Invasive Test Results: Intermediate-risk stress test findings: cardiac mortality 1-3%/year  Prior CABG: No previous CABG  [name]

## 2016-02-21 ENCOUNTER — Encounter (HOSPITAL_COMMUNITY): Payer: Self-pay | Admitting: Internal Medicine

## 2016-03-16 ENCOUNTER — Ambulatory Visit (INDEPENDENT_AMBULATORY_CARE_PROVIDER_SITE_OTHER): Payer: Medicare Other | Admitting: Cardiovascular Disease

## 2016-03-16 ENCOUNTER — Encounter: Payer: Self-pay | Admitting: Cardiovascular Disease

## 2016-03-16 VITALS — BP 132/72 | HR 99 | Ht 70.5 in | Wt 169.8 lb

## 2016-03-16 DIAGNOSIS — I10 Essential (primary) hypertension: Secondary | ICD-10-CM | POA: Diagnosis not present

## 2016-03-16 DIAGNOSIS — E7800 Pure hypercholesterolemia, unspecified: Secondary | ICD-10-CM

## 2016-03-16 NOTE — Progress Notes (Signed)
Cardiology Office Note   Date:  03/16/2016   ID:  William Arroyo, DOB 07-02-1940, MRN [id number]  PCP:  Pcp Not In System  Cardiologist:   Chilton Si, MD   Chief Complaint  Patient presents with  . Follow-up    Pt states no Sx.      History of Present Illness: William Arroyo is a 75 y.o. male with hypertension who presents for follow up. William Arroyo was first seen 02/14/16 with exertional fatigue. He had an exercise Cardiolite stress test on 01/06/16 and achieved 7 METS on a Bruce protocol.  There was concern for inferior ischemia, though there was also a possibility of diaphragm attenuation.  He had a LHC 02/20/16 that revealed 20% left main stenosis and mild disease in the LAD and D1 arteries. Since that time he has been feeling well. He denies any chest pain or shortness of breath. He also hasn't noted any lower extremity edema, orthopnea, or PND. He is completely without complaint at this time.   Past Medical History:  Diagnosis Date  . Arthritis   . Essential hypertension 02/14/2016  . Fatigue 02/14/2016  . Hyperlipidemia 02/14/2016  . Hypertension   . Prostate enlargement     Past Surgical History:  Procedure Laterality Date  . CARDIAC CATHETERIZATION N/A 02/20/2016   Procedure: Left Heart Cath and Coronary Angiography;  Surgeon: Yvonne Kendall, MD;  Location: [facility] INVASIVE CV LAB;  Service: Cardiovascular;  Laterality: N/A;  . HERNIA REPAIR    . JOINT REPLACEMENT    . TOTAL KNEE REVISION  02/02/2012   Procedure: TOTAL KNEE REVISION;  Surgeon: Valeria Batman, MD;  Location: [facility] OR;  Service: Orthopedics;  Laterality: Left;  Exploration Left Total knee replacement ,synovectomy and poly exchange articular and patella     Current Outpatient Prescriptions  Medication Sig Dispense Refill  . ALPRAZolam (XANAX) 1 MG tablet Take 1 mg by mouth at bedtime.     Marland Kitchen aspirin EC 81 MG tablet Take 81 mg by mouth daily.    . finasteride (PROSCAR) 5 MG tablet Take 5 mg by mouth  daily.    . hydroxychloroquine (PLAQUENIL) 200 MG tablet Take 200 mg by mouth 2 (two) times daily.    Marland Kitchen lisinopril-hydrochlorothiazide (PRINZIDE,ZESTORETIC) 20-12.5 MG tablet Take 1 tablet by mouth every morning.  1  . sildenafil (VIAGRA) 100 MG tablet Take 100 mg by mouth daily as needed for erectile dysfunction.     . simvastatin (ZOCOR) 40 MG tablet Take 40 mg by mouth every evening.     . Tamsulosin HCl (FLOMAX) 0.4 MG CAPS Take 0.4 mg by mouth 2 (two) times daily.     . Vitamin D, Ergocalciferol, (DRISDOL) 50000 units CAPS capsule Take 50,000 Units by mouth every Monday.   12   No current facility-administered medications for this visit.     Allergies:   Methotrexate derivatives    Social History:  The patient  reports that he has quit smoking. He has never used smokeless tobacco. He reports that he does not drink alcohol or use drugs.   Family History:  The patient's family history includes Diabetes in his brother; Stomach cancer in his sister.    ROS:  Please see the history of present illness.   Otherwise, review of systems are positive for none.   All other systems are reviewed and negative.    PHYSICAL EXAM: VS:  BP 132/72   Pulse 99   Ht 5' 10.5" (1.791 m)  Wt 77 kg (169 lb 12.8 oz)   BMI 24.02 kg/m  , BMI Body mass index is 24.02 kg/m. GENERAL:  Well appearing HEENT:  Pupils equal round and reactive, fundi not visualized, oral mucosa unremarkable NECK:  No jugular venous distention, waveform within normal limits, carotid upstroke brisk and symmetric, no bruits LYMPHATICS:  No cervical adenopathy LUNGS:  Clear to auscultation bilaterally HEART:  RRR.  PMI not displaced or sustained,S1 and S2 within normal limits, no S3, no S4, no clicks, no rubs, no murmurs ABD:  Flat, positive bowel sounds normal in frequency in pitch, no bruits, no rebound, no guarding, no midline pulsatile mass, no hepatomegaly, no splenomegaly EXT:  2 plus pulses throughout, no edema, no cyanosis  no clubbing SKIN:  No rashes no nodules NEURO:  Cranial nerves II through XII grossly intact, motor grossly intact throughout PSYCH:  Cognitively intact, oriented to person place and time   EKG:  EKG is ordered today. The ekg ordered today demonstrates sinus rhythm. Rate 80 bpm.  RBBB.   LHC 02/20/16: Conclusions: 1. 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 Distal branches of most vessels appear small with tapering, suggestive of diffuse small vessel atherosclerosis. 2. Normal left ventricular filling pressure.  Recent Labs: 02/14/2016: BUN 16; Creat 1.44; Hemoglobin 13.3; Platelets 339; Potassium 4.0; Sodium 138   01/02/16 WBC 7, hemoglobin 12.3, hematocrit 37.3, platelet 343 Sodium 137, potassium 4.6, UN 19, creatinine 1.44 Total cholesterol 144, triglycerides 79, HDL 45, LDL 83 TSH 1.68, free T4 1 0.42   Lipid Panel No results found for: CHOL, TRIG, HDL, CHOLHDL, VLDL, LDLCALC, LDLDIRECT    Wt Readings from Last 3 Encounters:  03/16/16 77 kg (169 lb 12.8 oz)  02/20/16 74.8 kg (165 lb)  02/14/16 75.1 kg (165 lb 9.6 oz)      ASSESSMENT AND PLAN:  # Abnormal stress test: William Arroyo had a false positive stress test. Cardiac cath revealed only mild, nonobstructive disease. Continue aspirin and simvastatin.  # Hypertension: Blood pressure well-controlled. Continue lisinopril and hydrochlorothiazide.  Consider adding a beta blocker if blood pressure is elevated in the future.  # Hyperlipidemia: Continue simvastatin. LDL 83 12/2015     Current medicines are reviewed at length with the patient today.  The patient does not have concerns regarding medicines.  The following changes have been made:  no change  Labs/ tests ordered today include:   No orders of the defined types were placed in this encounter.  Time spent: 20 minutes-Greater than 50% of this time was  spent in counseling, explanation of diagnosis, planning of further management, and coordination of care.   Disposition:   FU with Haider Hornaday C. Duke Salvia, MD, [facility] in 1 year.   This note was written with the assistance of speech recognition software.  Please excuse any transcriptional errors.  Signed, Jemia Fata C. Duke Salvia, MD, [facility]  03/16/2016 4:59 PM    [facility]

## 2016-03-16 NOTE — Patient Instructions (Signed)

## 2017-07-20 ENCOUNTER — Ambulatory Visit (INDEPENDENT_AMBULATORY_CARE_PROVIDER_SITE_OTHER): Payer: Medicare Other | Admitting: Cardiovascular Disease

## 2017-07-20 ENCOUNTER — Encounter: Payer: Self-pay | Admitting: Cardiovascular Disease

## 2017-07-20 VITALS — BP 90/58 | HR 86 | Ht 71.0 in | Wt 163.0 lb

## 2017-07-20 DIAGNOSIS — E7800 Pure hypercholesterolemia, unspecified: Secondary | ICD-10-CM | POA: Diagnosis not present

## 2017-07-20 DIAGNOSIS — I2510 Atherosclerotic heart disease of native coronary artery without angina pectoris: Secondary | ICD-10-CM | POA: Diagnosis not present

## 2017-07-20 DIAGNOSIS — I10 Essential (primary) hypertension: Secondary | ICD-10-CM | POA: Diagnosis not present

## 2017-07-20 MED ORDER — HYDROCHLOROTHIAZIDE 12.5 MG PO CAPS: 12.5000 mg | ORAL_CAPSULE | Freq: Every day | ORAL | 3 refills | Status: AC

## 2017-07-20 NOTE — Patient Instructions (Signed)
Medication Instructions:  STOP LISINOPRIL-HYDROCHLOROTHIAZIDE   START HYDROCHLOROTHIAZIDE 12.5 MG DAILY    Labwork: HAVE REQUESTED YOUR RECENT LABS FROM [facility] OFFICE   Testing/Procedures: NONE  Follow-Up: Your physician recommends that you schedule a follow-up appointment in: 1 MONTH WITH PHARM D FOR BLOOD PRESSURE  Your physician wants you to follow-up in: 1 YEAR OV  You will receive a reminder letter in the mail two months in advance. If you don't receive a letter, please call our office to schedule the follow-up appointment.  Any Other Special Instructions Will Be Listed Below (If Applicable). MONITOR AND LOG YOUR BLOOD PRESSURE. YOUR GOAL FOR THE TOP NUMBER IS 100-130. IF YOUR NUMBERS REMAIN IN THAT RANGE YOU DO NOT NEED TO FOLLOW UP IN 1 MONTH JUST 1 YEAR   If you need a refill on your cardiac medications before your next appointment, please call your pharmacy.

## 2017-07-20 NOTE — Progress Notes (Signed)
Cardiology Office Note   Date:  07/20/2017   ID:  William Arroyo, DOB [birth year], MRN [id number]  PCP:  Alton Revere, MD  Cardiologist:   Chilton Si, MD   No chief complaint on file.    History of Present Illness: William Arroyo is a 77 y.o. male with non-obstructive CAD, hypertension, and hyperlipidemia who presents for follow up. William Arroyo was first seen 02/14/16 with exertional fatigue. He had an exercise Cardiolite stress test on 01/06/16 and achieved 7 METS on a Bruce protocol.  There was concern for inferior ischemia, though there was also a possibility of diaphragm attenuation.  He had a LHC 02/20/16 that revealed 20% left main stenosis and mild disease in the LAD and D1 arteries.  William Arroyo has been feeling well.  He has no chest pain or shortness of breath.  He exercises by doing activities around his home.  He has no exertional symptoms.  He had his wife note that his appetite has been poor.  He is slowly losing weight.  He takes all medications as prescribed.  He has no chest pain or shortness of breath.  He has occasional swelling in his left leg that he attributes to arthritis.  He has no orthopnea or PND.  He reports that he recently had labs checked with his PCP.   Past Medical History:  Diagnosis Date  . Arthritis   . Essential hypertension 02/14/2016  . Fatigue 02/14/2016  . Hyperlipidemia 02/14/2016  . Hypertension   . Prostate enlargement     Past Surgical History:  Procedure Laterality Date  . CARDIAC CATHETERIZATION N/A 02/20/2016   Procedure: Left Heart Cath and Coronary Angiography;  Surgeon: Yvonne Kendall, MD;  Location: [facility] INVASIVE CV LAB;  Service: Cardiovascular;  Laterality: N/A;  . HERNIA REPAIR    . JOINT REPLACEMENT    . TOTAL KNEE REVISION  02/02/2012   Procedure: TOTAL KNEE REVISION;  Surgeon: Valeria Batman, MD;  Location: [facility] OR;  Service: Orthopedics;  Laterality: Left;  Exploration Left Total knee replacement ,synovectomy and poly  exchange articular and patella     Current Outpatient Medications  Medication Sig Dispense Refill  . ALPRAZolam (XANAX) 1 MG tablet Take 1 mg by mouth at bedtime.     Marland Kitchen aspirin EC 81 MG tablet Take 81 mg by mouth daily.    . hydroxychloroquine (PLAQUENIL) 200 MG tablet Take by mouth 2 (two) times daily.    . sildenafil (VIAGRA) 100 MG tablet Take 100 mg by mouth daily as needed for erectile dysfunction.     . Tamsulosin HCl (FLOMAX) 0.4 MG CAPS Take 0.4 mg by mouth 2 (two) times daily.     . Vitamin D, Ergocalciferol, (DRISDOL) 50000 units CAPS capsule Take 50,000 Units by mouth every Monday.   12  . hydrochlorothiazide (MICROZIDE) 12.5 MG capsule Take 1 capsule (12.5 mg total) by mouth daily. 90 capsule 3   No current facility-administered medications for this visit.     Allergies:   Methotrexate derivatives    Social History:  The patient  reports that he has quit smoking. he has never used smokeless tobacco. He reports that he does not drink alcohol or use drugs.   Family History:  The patient's family history includes Diabetes in his brother; Stomach cancer in his sister.    ROS:  Please see the history of present illness.   Otherwise, review of systems are positive for none.   All other systems are reviewed and  negative.    PHYSICAL EXAM: VS:  BP (!) 90/58   Pulse 86   Ht 5\' 11"  (1.803 m)   Wt 163 lb (73.9 kg)   BMI 22.73 kg/m  , BMI Body mass index is 22.73 kg/m. GENERAL:  Well appearing HEENT:  Pupils equal round and reactive, fundi not visualized, oral mucosa unremarkable NECK:  No jugular venous distention, waveform within normal limits, carotid upstroke brisk and symmetric, no bruits LYMPHATICS:  No cervical adenopathy LUNGS:  Clear to auscultation bilaterally HEART:  RRR.  PMI not displaced or sustained,S1 and S2 within normal limits, no S3, no S4, no clicks, no rubs, no murmurs ABD:  Flat, positive bowel sounds normal in frequency in pitch, no bruits, no rebound,  no guarding, no midline pulsatile mass, no hepatomegaly, no splenomegaly EXT:  2 plus pulses throughout, no edema, no cyanosis no clubbing SKIN:  No rashes no nodules NEURO:  Cranial nerves II through XII grossly intact, motor grossly intact throughout PSYCH:  Cognitively intact, oriented to person place and time   EKG:  EKG is ordered today. The ekg ordered 02/14/16 demonstrates sinus rhythm. Rate 80 bpm.  RBBB.  07/20/17: Sinus rhythm.  Rate 86 bpm.  LAD.   LHC 02/20/16: Conclusions: 1. 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 Distal branches of most vessels appear small with tapering, suggestive of diffuse small vessel atherosclerosis. 2. Normal left ventricular filling pressure.  Recent Labs: No results found for requested labs within last 8760 hours.   01/02/16 WBC 7, hemoglobin 12.3, hematocrit 37.3, platelet 343 Sodium 137, potassium 4.6, UN 19, creatinine 1.44 Total cholesterol 144, triglycerides 79, HDL 45, LDL 83 TSH 1.68, free T4 1 0.42   Lipid Panel No results found for: CHOL, TRIG, HDL, CHOLHDL, VLDL, LDLCALC, LDLDIRECT    Wt Readings from Last 3 Encounters:  07/20/17 163 lb (73.9 kg)  03/16/16 169 lb 12.8 oz (77 kg)  02/20/16 165 lb (74.8 kg)      ASSESSMENT AND PLAN:  # Non-obstructive CAD: William Arroyo had a false positive stress test. Cardiac cath revealed only mild, nonobstructive disease. Continue aspirin.  # Hypertension: Blood pressure has been low.  Stop lisinopril and start HCTZ 12.5mg  daily.    # Hyperlipidemia: Continue simvastatin. LDL 83 12/2015.  He had recent labs with his PCP.  Will request. Goal LDL <70.    Current medicines are reviewed at length with the patient today.  The patient does not have concerns regarding medicines.  The following changes have been made:  no change  Labs/ tests ordered today include:   No orders of  the defined types were placed in this encounter.  Time spent: 20 minutes-Greater than 50% of this time was spent in counseling, explanation of diagnosis, planning of further management, and coordination of care.   Disposition:   FU with Arpita Fentress C. 01/2016, MD, [facility] in 1 year.   This note was written with the assistance of speech recognition software.  Please excuse any transcriptional errors.  Signed, Royal Beirne C. [facility], MD, [facility]  07/20/2017 5:18 PM    [facility]

## 2019-01-23 ENCOUNTER — Ambulatory Visit: Payer: PRIVATE HEALTH INSURANCE | Admitting: Cardiovascular Disease

## 2019-04-12 ENCOUNTER — Ambulatory Visit (INDEPENDENT_AMBULATORY_CARE_PROVIDER_SITE_OTHER): Payer: Medicare Other | Admitting: Orthopaedic Surgery

## 2019-04-12 ENCOUNTER — Encounter: Payer: Self-pay | Admitting: Orthopaedic Surgery

## 2019-04-12 ENCOUNTER — Other Ambulatory Visit: Payer: Self-pay

## 2019-04-12 DIAGNOSIS — M170 Bilateral primary osteoarthritis of knee: Secondary | ICD-10-CM

## 2019-04-12 NOTE — Progress Notes (Signed)
Office Visit Note   Patient: William Arroyo           Date of Birth: Feb 07, 1941           MRN: [id number] Visit Date: 04/12/2019              Requested by: Burton Apley, MD [address] [place] [address] PCP: Burton Apley, MD   Assessment & Plan: Visit Diagnoses:  1. Bilateral primary osteoarthritis of knee     Plan: Recent onset of right knee pain with swelling.  Seen by his primary care physician and given a Dosepak with complete resolution of his symptoms.  Presently asymptomatic.  Had prior left knee replacement without complications.  I suspect his recent episode of right knee pain was related to arthritis.  He does have grade 3 renal disease and cannot take NSAIDs.  Exam today of his right knee was benign.  Would suggest cortisone injections in the future if needed  Follow-Up Instructions: Return if symptoms worsen or fail to improve.   Orders:  No orders of the defined types were placed in this encounter.  No orders of the defined types were placed in this encounter.     Procedures: No procedures performed   Clinical Data: No additional findings.   Subjective: Chief Complaint  Patient presents with  . Right Knee - Pain  Patient presents today with right knee pain. He said that it has been hurting for 6 weeks. No known injury. The pain was located anteriorly, with swelling behind his knee. He saw his PCP and was prescribed a dose pak. He currently is doing better and no complaints of pain today.   HPI  Review of Systems   Objective: Vital Signs: Ht 5\' 11"  (1.803 m)   Wt 172 lb (78 kg)   BMI 23.99 kg/m   Physical Exam Constitutional:      Appearance: He is well-developed.  Eyes:     Pupils: Pupils are equal, round, and reactive to light.  Pulmonary:     Effort: Pulmonary effort is normal.  Skin:    General: Skin is warm and dry.  Neurological:     Mental Status: He is alert and oriented to person, place, and time.  Psychiatric:         Behavior: Behavior normal.     Ortho Exam awake alert and oriented x3 comfortable sitting in no pain with ambulation.  Right knee had full extension of flexion over 115 degrees without instability.  No effusion.  No local tenderness.  Some thickening of the skin over the patella and patellar crepitation but again no evidence of instability.  Specialty Comments:  No specialty comments available.  Imaging: No results found.   PMFS History: Patient Active Problem List   Diagnosis Date Noted  . Bilateral primary osteoarthritis of knee 04/12/2019  . Other fatigue 02/14/2016  . Abnormal stress test 02/14/2016  . Essential hypertension 02/14/2016  . Hyperlipidemia 02/14/2016   Past Medical History:  Diagnosis Date  . Arthritis   . Essential hypertension 02/14/2016  . Fatigue 02/14/2016  . Hyperlipidemia 02/14/2016  . Hypertension   . Prostate enlargement     Family History  Problem Relation Age of Onset  . Stomach cancer Sister   . Diabetes Brother     Past Surgical History:  Procedure Laterality Date  . CARDIAC CATHETERIZATION N/A 02/20/2016   Procedure: Left Heart Cath and Coronary Angiography;  Surgeon: Nelva Bush, MD;  Location: [facility] INVASIVE CV  LAB;  Service: Cardiovascular;  Laterality: N/A;  . HERNIA REPAIR    . JOINT REPLACEMENT    . TOTAL KNEE REVISION  02/02/2012   Procedure: TOTAL KNEE REVISION;  Surgeon: Valeria Batman, MD;  Location: [facility] OR;  Service: Orthopedics;  Laterality: Left;  Exploration Left Total knee replacement ,synovectomy and poly exchange articular and patella   Social History   Occupational History  . Not on file  Tobacco Use  . Smoking status: Former Games developer  . Smokeless tobacco: Never Used  Substance and Sexual Activity  . Alcohol use: No  . Drug use: No  . Sexual activity: Not on file

## 2019-07-19 ENCOUNTER — Ambulatory Visit (INDEPENDENT_AMBULATORY_CARE_PROVIDER_SITE_OTHER): Payer: Medicare Other | Admitting: Orthopaedic Surgery

## 2019-07-19 ENCOUNTER — Ambulatory Visit (INDEPENDENT_AMBULATORY_CARE_PROVIDER_SITE_OTHER): Payer: Medicare Other

## 2019-07-19 ENCOUNTER — Encounter: Payer: Self-pay | Admitting: Orthopaedic Surgery

## 2019-07-19 VITALS — BP 131/92 | HR 80 | Ht 71.0 in | Wt 172.0 lb

## 2019-07-19 DIAGNOSIS — M1711 Unilateral primary osteoarthritis, right knee: Secondary | ICD-10-CM | POA: Insufficient documentation

## 2019-07-19 DIAGNOSIS — G8929 Other chronic pain: Secondary | ICD-10-CM

## 2019-07-19 DIAGNOSIS — M25561 Pain in right knee: Secondary | ICD-10-CM | POA: Diagnosis not present

## 2019-07-19 NOTE — Progress Notes (Signed)
Office Visit Note   Patient: William Arroyo           Date of Birth: 06-20-1940           MRN: [id number] Visit Date: 07/19/2019              Requested by: Alton Revere, MD [address] [place],  [place] [address] PCP: Alton Revere, MD   Assessment & Plan: Visit Diagnoses:  1. Chronic pain of right knee   2. Unilateral primary osteoarthritis, right knee     Plan: Primary osteoarthritis right knee.  Status post successful left total knee replacement.  Family physician placed William Arroyo on a prednisone Dosepak which resolved his problem.  Presently doing quite well.  Have had long discussion regarding ongoing treatment with cortisone injection and even Visco supplementation.  I do not think at this point that he is considering a knee replacement  Follow-Up Instructions: Return if symptoms worsen or fail to improve.   Orders:  Orders Placed This Encounter  Procedures  . XR KNEE 3 VIEW RIGHT   No orders of the defined types were placed in this encounter.     Procedures: No procedures performed   Clinical Data: No additional findings.   Subjective: Chief Complaint  Patient presents with  . Right Knee - Pain  Patient presents today for recurrent right knee pain. He was last evaluated in December of 2020. His leg was swelling, along with pain. His knee feels stiff in the mornings, but gets better as the day progresses. He has has had an ultrasound to check for DVT at the end of February 2021, ordered by PCP and it was negative. He has had prednisone and also used ice and heat for relief.  Has history of stage III chronic renal disease followed by his family physician.  He has had some recurrent swelling his lower extremities as a result of the above.  He has had a prior left total knee replacement and is doing well from that standpoint.  He is not having any discomfort at the present time and very minimal swelling of his right leg.  HPI  Review of  Systems   Objective: Vital Signs: BP (!) 131/92   Pulse 80   Ht 5\' 11"  (1.803 m)   Wt 172 lb (78 kg)   BMI 23.99 kg/m   Physical Exam Constitutional:      Appearance: He is well-developed.  Eyes:     Pupils: Pupils are equal, round, and reactive to light.  Pulmonary:     Effort: Pulmonary effort is normal.  Skin:    General: Skin is warm and dry.  Neurological:     Mental Status: He is alert and oriented to person, place, and time.  Psychiatric:        Behavior: Behavior normal.     Ortho Exam hard of hearing with hearing aids.  Right knee was not hot warm or red.  No effusion.  Full quick extension flexed over 105 degrees without instability.  No popliteal pain or mass.  No calf pain.  Very minimal nonpitting edema of the ankle.  +1 pulses sensory exam intact.  Straight leg raise negative  Specialty Comments:  No specialty comments available.  Imaging: XR KNEE 3 VIEW RIGHT  Result Date: 07/19/2019 Films of the right knee were obtained in several projections standing.  There is a moderate amount of arthritic change in the lateral compartment with irregularity of the joint surface and subchondral sclerosis and  small peripheral osteophytes.  Appears to have normal alignment.  Does have patellofemoral arthritis as well and to a lesser extent degenerative changes in the medial compartment.  Films are consistent with moderate osteoarthritis.  Patient has had a prior left total knee replacement and doing well    PMFS History: Patient Active Problem List   Diagnosis Date Noted  . Unilateral primary osteoarthritis, right knee 07/19/2019  . Bilateral primary osteoarthritis of knee 04/12/2019  . Other fatigue 02/14/2016  . Abnormal stress test 02/14/2016  . Essential hypertension 02/14/2016  . Hyperlipidemia 02/14/2016   Past Medical History:  Diagnosis Date  . Arthritis   . Essential hypertension 02/14/2016  . Fatigue 02/14/2016  . Hyperlipidemia 02/14/2016  . Hypertension    . Prostate enlargement     Family History  Problem Relation Age of Onset  . Stomach cancer Sister   . Diabetes Brother     Past Surgical History:  Procedure Laterality Date  . CARDIAC CATHETERIZATION N/A 02/20/2016   Procedure: Left Heart Cath and Coronary Angiography;  Surgeon: Nelva Bush, MD;  Location: [facility] CV LAB;  Service: Cardiovascular;  Laterality: N/A;  . HERNIA REPAIR    . JOINT REPLACEMENT    . TOTAL KNEE REVISION  02/02/2012   Procedure: TOTAL KNEE REVISION;  Surgeon: Garald Balding, MD;  Location: [facility];  Service: Orthopedics;  Laterality: Left;  Exploration Left Total knee replacement ,synovectomy and poly exchange articular and patella   Social History   Occupational History  . Not on file  Tobacco Use  . Smoking status: Former Research scientist (life sciences)  . Smokeless tobacco: Never Used  Substance and Sexual Activity  . Alcohol use: No  . Drug use: No  . Sexual activity: Not on file

## 2019-08-10 ENCOUNTER — Ambulatory Visit (INDEPENDENT_AMBULATORY_CARE_PROVIDER_SITE_OTHER): Payer: Medicare Other | Admitting: Orthopaedic Surgery

## 2019-08-10 ENCOUNTER — Other Ambulatory Visit: Payer: Self-pay

## 2019-08-10 ENCOUNTER — Encounter: Payer: Self-pay | Admitting: Orthopaedic Surgery

## 2019-08-10 VITALS — Ht 71.0 in | Wt 172.0 lb

## 2019-08-10 DIAGNOSIS — M170 Bilateral primary osteoarthritis of knee: Secondary | ICD-10-CM | POA: Diagnosis not present

## 2019-08-10 MED ORDER — LIDOCAINE HCL 1 % IJ SOLN
2.0000 mL | INTRAMUSCULAR | Status: AC | PRN
  Administered 2019-08-10: 15:00:00 2 mL

## 2019-08-10 MED ORDER — METHYLPREDNISOLONE ACETATE 40 MG/ML IJ SUSP
80.0000 mg | INTRAMUSCULAR | Status: AC | PRN
  Administered 2019-08-10: 15:00:00 80 mg via INTRA_ARTICULAR

## 2019-08-10 MED ORDER — BUPIVACAINE HCL 0.5 % IJ SOLN
2.0000 mL | INTRAMUSCULAR | Status: AC | PRN
  Administered 2019-08-10: 15:00:00 2 mL via INTRA_ARTICULAR

## 2019-08-10 NOTE — Addendum Note (Signed)
Addended by: Wendi Maya on: 08/10/2019 04:13 PM   Modules accepted: Orders

## 2019-08-10 NOTE — Progress Notes (Signed)
Office Visit Note   Patient: William Arroyo           Date of Birth: July 26, 1940           MRN: [id number] Visit Date: 08/10/2019              Requested by: Burton Apley, MD [address] [place] [address] PCP: Burton Apley, MD   Assessment & Plan: Visit Diagnoses:  1. Bilateral primary osteoarthritis of knee     Plan: Recurrent effusion right knee.  This was aspirated of 60 cc of cloudy fluid.  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  We will send the fluid for for cell cell count and crystals  Follow-Up Instructions: Return if symptoms worsen or fail to improve.   Orders:  Orders Placed This Encounter  Procedures  . Large Joint Inj: R knee   No orders of the defined types were placed in this encounter.     Procedures: Large Joint Inj: R knee on 08/10/2019 2:46 PM Indications: pain and diagnostic evaluation Details: 25 G 1.5 in needle, anteromedial approach  Arthrogram: No  Medications: 2 mL lidocaine 1 %; 2 mL bupivacaine 0.5 %; 80 mg methylPREDNISolone acetate 40 MG/ML Aspirate: 60 mL cloudy and yellow; sent for lab analysis Procedure, treatment alternatives, risks and benefits explained, specific risks discussed. Consent was given by the patient. Immediately prior to procedure a time out was called to verify the correct patient, procedure, equipment, support staff and site/side marked as required. Patient was prepped and draped in the usual sterile fashion.       Clinical Data: No additional findings.   Subjective: Chief Complaint  Patient presents with  . Right Knee - Pain, Follow-up  Patient presents today for recurrent right knee pain. He was last seen a few weeks ago. Patient states that he returns today because he wants a cortisone injection. He is not diabetic. His pain in his knee is almost constant and located all throughout his knee. He is not taking anything for pain. He states that  his knee is swollen today.   HPI  Review of Systems   Objective: Vital Signs: Ht 5\' 11"  (1.803 m)   Wt 172 lb (78 kg)   BMI 23.99 kg/m   Physical Exam Constitutional:      Appearance: He is well-developed.  Eyes:     Pupils: Pupils are equal, round, and reactive to light.  Pulmonary:     Effort: Pulmonary effort is normal.  Skin:    General: Skin is warm and dry.  Neurological:     Mental Status: He is alert and oriented to person, place, and time.  Psychiatric:        Behavior: Behavior normal.     Ortho Exam large effusion right knee with some tenderness along the medial compartment.  Lacks a few degrees to full extension based on the size of the effusion.  Flexed about 90 degrees.  No instability motor exam intact  Specialty Comments:  No specialty comments available.  Imaging: No results found.   PMFS History: Patient Active Problem List   Diagnosis Date Noted  . Unilateral primary osteoarthritis, right knee 07/19/2019  . Bilateral primary osteoarthritis of knee 04/12/2019  . Other fatigue 02/14/2016  . Abnormal stress test 02/14/2016  . Essential hypertension 02/14/2016  . Hyperlipidemia 02/14/2016   Past Medical History:  Diagnosis Date  . Arthritis   . Essential  hypertension 02/14/2016  . Fatigue 02/14/2016  . Hyperlipidemia 02/14/2016  . Hypertension   . Prostate enlargement     Family History  Problem Relation Age of Onset  . Stomach cancer Sister   . Diabetes Brother     Past Surgical History:  Procedure Laterality Date  . CARDIAC CATHETERIZATION N/A 02/20/2016   Procedure: Left Heart Cath and Coronary Angiography;  Surgeon: Yvonne Kendall, MD;  Location: [facility] INVASIVE CV LAB;  Service: Cardiovascular;  Laterality: N/A;  . HERNIA REPAIR    . JOINT REPLACEMENT    . TOTAL KNEE REVISION  02/02/2012   Procedure: TOTAL KNEE REVISION;  Surgeon: Valeria Batman, MD;  Location: [facility] OR;  Service: Orthopedics;  Laterality: Left;  Exploration Left Total  knee replacement ,synovectomy and poly exchange articular and patella   Social History   Occupational History  . Not on file  Tobacco Use  . Smoking status: Former Games developer  . Smokeless tobacco: Never Used  Substance and Sexual Activity  . Alcohol use: No  . Drug use: No  . Sexual activity: Not on file

## 2019-08-11 LAB — SYNOVIAL CELL COUNT + DIFF, W/ CRYSTALS
Basophils, %: 0 %
Eosinophils-Synovial: 0 % (ref 0–2)
Lymphocytes-Synovial Fld: 4 % (ref 0–74)
Monocyte/Macrophage: 14 % (ref 0–69)
Neutrophil, Synovial: 82 % — ABNORMAL HIGH (ref 0–24)
Synoviocytes, %: 0 % (ref 0–15)
WBC, Synovial: 14140 cells/uL — ABNORMAL HIGH (ref <150)

## 2019-10-25 ENCOUNTER — Ambulatory Visit (INDEPENDENT_AMBULATORY_CARE_PROVIDER_SITE_OTHER): Payer: Medicare Other | Admitting: Orthopaedic Surgery

## 2019-10-25 ENCOUNTER — Other Ambulatory Visit: Payer: Self-pay

## 2019-10-25 ENCOUNTER — Encounter: Payer: Self-pay | Admitting: Orthopaedic Surgery

## 2019-10-25 VITALS — Ht 71.0 in | Wt 172.0 lb

## 2019-10-25 DIAGNOSIS — M1711 Unilateral primary osteoarthritis, right knee: Secondary | ICD-10-CM | POA: Diagnosis not present

## 2019-10-25 DIAGNOSIS — G8929 Other chronic pain: Secondary | ICD-10-CM

## 2019-10-25 DIAGNOSIS — M25561 Pain in right knee: Secondary | ICD-10-CM

## 2019-10-25 MED ORDER — METHYLPREDNISOLONE ACETATE 40 MG/ML IJ SUSP
80.0000 mg | INTRAMUSCULAR | Status: AC | PRN
  Administered 2019-10-25: 80 mg via INTRA_ARTICULAR

## 2019-10-25 MED ORDER — BUPIVACAINE HCL 0.5 % IJ SOLN
2.0000 mL | INTRAMUSCULAR | Status: AC | PRN
  Administered 2019-10-25: 2 mL via INTRA_ARTICULAR

## 2019-10-25 MED ORDER — LIDOCAINE HCL 1 % IJ SOLN
2.0000 mL | INTRAMUSCULAR | Status: AC | PRN
  Administered 2019-10-25: 2 mL

## 2019-10-25 NOTE — Progress Notes (Signed)
Office Visit Note   Patient: William Arroyo           Date of Birth: 03-May-1941           MRN: [id number] Visit Date: 10/25/2019              Requested by: Alton Revere, MD [address] [place],  [place] [address] PCP: Alton Revere, MD   Assessment & Plan: Visit Diagnoses:  1. Chronic pain of right knee   2. Unilateral primary osteoarthritis, right knee     Plan: Mr. Umscheid has arthritis in his right knee.  I have seen him on several occasions in the past for knee aspiration and cortisone injection with relief.  He was seen several months ago with aspirate demonstrating increased white cells and polys.  He did well with a cortisone injection.  He does have a diagnosis of rheumatoid arthritis treated presently with prednisone and hydroxychloroquine.  He has had a successful left total knee replacement performed at [facility] years ago.  X-rays of his right knee recently demonstrated moderate arthritic changes.  I am going to reaspirate his knee and inject cortisone.  Long discussion over nearly 40 minutes regarding his present status and the fact that his knee arthritis is getting "worse".  He should consider knee replacement has an appointment with his rheumatologist in the next month or 2.  Return as needed  Follow-Up Instructions: Return if symptoms worsen or fail to improve.   Orders:  Orders Placed This Encounter  Procedures  . Anaerobic and Aerobic Culture  . Synovial cell count + diff, w/ crystals   No orders of the defined types were placed in this encounter.     Procedures: Large Joint Inj: R knee on 10/25/2019 11:45 AM Indications: pain and diagnostic evaluation Details: 25 G 1.5 in needle, superior approach  Arthrogram: No  Medications: 2 mL lidocaine 1 %; 2 mL bupivacaine 0.5 %; 80 mg methylPREDNISolone acetate 40 MG/ML Aspirate: 35 mL yellow and cloudy; sent for lab analysis Procedure, treatment alternatives, risks and benefits explained, specific  risks discussed. Consent was given by the patient. Immediately prior to procedure a time out was called to verify the correct patient, procedure, equipment, support staff and site/side marked as required. Patient was prepped and draped in the usual sterile fashion.       Clinical Data: No additional findings.   Subjective: Chief Complaint  Patient presents with  . Right Knee - Pain  Patient presents today for his recurrent right knee pain. He was here last April 1st 2021 and received a right knee cortisone injection. He states that the injection lasted about 5 weeks. His PCP gave him another cortisone injection in the beginning of May and lasted maybe 3 weeks. He states that his knee is painful and swells. He has been taking meloxicam, with no relief.  Also taking prednisone and hydroxychloroquine per his rheumatologist with a prior diagnosis of rheumatoid arthritis.  Films of the right knee performed 3 months ago demonstrated moderate degenerative changes in all 3 compartments  HPI  Review of Systems   Objective: Vital Signs: Ht 5\' 11"  (1.803 m)   Wt 172 lb (78 kg)   BMI 23.99 kg/m   Physical Exam Constitutional:      Appearance: He is well-developed.  Eyes:     Pupils: Pupils are equal, round, and reactive to light.  Pulmonary:     Effort: Pulmonary effort is normal.  Skin:    General: Skin  is warm and dry.  Neurological:     Mental Status: He is alert and oriented to person, place, and time.  Psychiatric:        Behavior: Behavior normal.     Ortho Exam hard of hearing.  Wears hearing aids.  Right knee with large effusion and mild increased heat.  Having medial lateral joint pain but without instability.  Appears to have full extension of flexed over 100 degrees.  No popliteal pain or mass.  No calf pain.  Motor exam intact.  Straight leg raise negative.  Painless range of motion both hips Specialty Comments:  No specialty comments available.  Imaging: No results  found.   PMFS History: Patient Active Problem List   Diagnosis Date Noted  . Unilateral primary osteoarthritis, right knee 07/19/2019  . Bilateral primary osteoarthritis of knee 04/12/2019  . Other fatigue 02/14/2016  . Abnormal stress test 02/14/2016  . Essential hypertension 02/14/2016  . Hyperlipidemia 02/14/2016   Past Medical History:  Diagnosis Date  . Arthritis   . Essential hypertension 02/14/2016  . Fatigue 02/14/2016  . Hyperlipidemia 02/14/2016  . Hypertension   . Prostate enlargement     Family History  Problem Relation Age of Onset  . Stomach cancer Sister   . Diabetes Brother     Past Surgical History:  Procedure Laterality Date  . CARDIAC CATHETERIZATION N/A 02/20/2016   Procedure: Left Heart Cath and Coronary Angiography;  Surgeon: Nelva Bush, MD;  Location: [facility];  Service: Cardiovascular;  Laterality: N/A;  . HERNIA REPAIR    . JOINT REPLACEMENT    . TOTAL KNEE REVISION  02/02/2012   Procedure: TOTAL KNEE REVISION;  Surgeon: Garald Balding, MD;  Location: [facility];  Service: Orthopedics;  Laterality: Left;  Exploration Left Total knee replacement ,synovectomy and poly exchange articular and patella   Social History   Occupational History  . Not on file  Tobacco Use  . Smoking status: Former Research scientist (life sciences)  . Smokeless tobacco: Never Used  Substance and Sexual Activity  . Alcohol use: No  . Drug use: No  . Sexual activity: Not on file

## 2019-11-21 ENCOUNTER — Telehealth: Payer: Self-pay | Admitting: Orthopaedic Surgery

## 2019-11-21 NOTE — Telephone Encounter (Signed)
Please call tomorrow. °

## 2019-11-21 NOTE — Telephone Encounter (Signed)
Pt called wanting to get a CB with the results of the fluid that was drawn from his knee; they will be back in town on 11/22/19 and would like to receive a call then.  [phone]

## 2019-11-21 NOTE — Telephone Encounter (Signed)
Lauren-I do not see any results from his June visit-lab sent to [facility]. Need to check with the [facility] office tomorrow for the results

## 2019-11-22 NOTE — Telephone Encounter (Signed)
Called.

## 2019-11-22 NOTE — Telephone Encounter (Signed)
William Arroyo is calling for lab results.

## 2020-09-19 ENCOUNTER — Telehealth: Payer: Self-pay | Admitting: *Deleted

## 2020-09-19 NOTE — Telephone Encounter (Signed)
William Arroyo wife left a message yesterday regarding the letter he received and said he had passed away last week :( Nothing in his chart is notated, changed, etc. I wanted you to know.   Received above message from Ulanda Edison Administrator   Will forward to Dr Duke Salvia as Lorain Childes and medical records

## 2020-10-09 DEATH — deceased
